# Patient Record
Sex: Female | Born: 1965 | Race: White | Hispanic: No | Marital: Married | Smoking: Never smoker
Health system: Southern US, Community
[De-identification: ages and names within clinical notes are randomized; demographics above are authoritative.]

## PROBLEM LIST (undated history)

## (undated) DIAGNOSIS — E079 Disorder of thyroid, unspecified: Secondary | ICD-10-CM

## (undated) DIAGNOSIS — D649 Anemia, unspecified: Secondary | ICD-10-CM

## (undated) DIAGNOSIS — Z808 Family history of malignant neoplasm of other organs or systems: Secondary | ICD-10-CM

## (undated) DIAGNOSIS — Z803 Family history of malignant neoplasm of breast: Secondary | ICD-10-CM

## (undated) DIAGNOSIS — E039 Hypothyroidism, unspecified: Secondary | ICD-10-CM

## (undated) DIAGNOSIS — Z923 Personal history of irradiation: Secondary | ICD-10-CM

## (undated) DIAGNOSIS — C801 Malignant (primary) neoplasm, unspecified: Secondary | ICD-10-CM

## (undated) HISTORY — DX: Family history of malignant neoplasm of other organs or systems: Z80.8

## (undated) HISTORY — DX: Family history of malignant neoplasm of breast: Z80.3

## (undated) HISTORY — DX: Disorder of thyroid, unspecified: E07.9

## (undated) HISTORY — PX: WISDOM TOOTH EXTRACTION: SHX21

---

## 1997-05-19 ENCOUNTER — Inpatient Hospital Stay (HOSPITAL_COMMUNITY): Admission: AD | Admit: 1997-05-19 | Discharge: 1997-05-21 | Payer: Self-pay | Admitting: Obstetrics and Gynecology

## 1997-05-23 ENCOUNTER — Encounter (HOSPITAL_COMMUNITY): Admission: RE | Admit: 1997-05-23 | Discharge: 1997-08-21 | Payer: Self-pay | Admitting: Obstetrics and Gynecology

## 1997-06-26 ENCOUNTER — Other Ambulatory Visit: Admission: RE | Admit: 1997-06-26 | Discharge: 1997-06-26 | Payer: Self-pay | Admitting: Obstetrics and Gynecology

## 1997-08-23 ENCOUNTER — Encounter (HOSPITAL_COMMUNITY): Admission: RE | Admit: 1997-08-23 | Discharge: 1997-11-21 | Payer: Self-pay | Admitting: *Deleted

## 1998-06-28 ENCOUNTER — Other Ambulatory Visit: Admission: RE | Admit: 1998-06-28 | Discharge: 1998-06-28 | Payer: Self-pay | Admitting: Obstetrics and Gynecology

## 1999-04-11 ENCOUNTER — Other Ambulatory Visit: Admission: RE | Admit: 1999-04-11 | Discharge: 1999-04-11 | Payer: Self-pay | Admitting: Obstetrics and Gynecology

## 1999-08-03 ENCOUNTER — Inpatient Hospital Stay (HOSPITAL_COMMUNITY): Admission: AD | Admit: 1999-08-03 | Discharge: 1999-08-03 | Payer: Self-pay | Admitting: *Deleted

## 1999-11-01 ENCOUNTER — Encounter: Payer: Self-pay | Admitting: Obstetrics and Gynecology

## 1999-11-01 ENCOUNTER — Inpatient Hospital Stay (HOSPITAL_COMMUNITY): Admission: AD | Admit: 1999-11-01 | Discharge: 1999-11-01 | Payer: Self-pay | Admitting: Obstetrics and Gynecology

## 1999-11-12 ENCOUNTER — Inpatient Hospital Stay (HOSPITAL_COMMUNITY): Admission: AD | Admit: 1999-11-12 | Discharge: 1999-11-15 | Payer: Self-pay | Admitting: Obstetrics and Gynecology

## 1999-11-16 ENCOUNTER — Encounter: Admission: RE | Admit: 1999-11-16 | Discharge: 2000-02-14 | Payer: Self-pay | Admitting: Obstetrics and Gynecology

## 1999-12-26 ENCOUNTER — Other Ambulatory Visit: Admission: RE | Admit: 1999-12-26 | Discharge: 1999-12-26 | Payer: Self-pay | Admitting: Obstetrics and Gynecology

## 2000-02-15 ENCOUNTER — Encounter: Admission: RE | Admit: 2000-02-15 | Discharge: 2000-03-27 | Payer: Self-pay | Admitting: Obstetrics and Gynecology

## 2001-02-18 ENCOUNTER — Other Ambulatory Visit: Admission: RE | Admit: 2001-02-18 | Discharge: 2001-02-18 | Payer: Self-pay | Admitting: Obstetrics and Gynecology

## 2001-09-20 ENCOUNTER — Inpatient Hospital Stay (HOSPITAL_COMMUNITY): Admission: AD | Admit: 2001-09-20 | Discharge: 2001-09-23 | Payer: Self-pay | Admitting: Obstetrics and Gynecology

## 2001-10-24 ENCOUNTER — Other Ambulatory Visit: Admission: RE | Admit: 2001-10-24 | Discharge: 2001-10-24 | Payer: Self-pay | Admitting: Obstetrics and Gynecology

## 2002-09-22 ENCOUNTER — Other Ambulatory Visit: Admission: RE | Admit: 2002-09-22 | Discharge: 2002-09-22 | Payer: Self-pay | Admitting: Obstetrics and Gynecology

## 2011-01-16 DIAGNOSIS — N951 Menopausal and female climacteric states: Secondary | ICD-10-CM | POA: Insufficient documentation

## 2011-01-16 DIAGNOSIS — E039 Hypothyroidism, unspecified: Secondary | ICD-10-CM | POA: Insufficient documentation

## 2014-07-21 ENCOUNTER — Ambulatory Visit (INDEPENDENT_AMBULATORY_CARE_PROVIDER_SITE_OTHER): Payer: Managed Care, Other (non HMO)

## 2014-07-21 ENCOUNTER — Ambulatory Visit (INDEPENDENT_AMBULATORY_CARE_PROVIDER_SITE_OTHER): Payer: Managed Care, Other (non HMO) | Admitting: Family Medicine

## 2014-07-21 ENCOUNTER — Ambulatory Visit (HOSPITAL_COMMUNITY)
Admission: RE | Admit: 2014-07-21 | Discharge: 2014-07-21 | Disposition: A | Discharge status: Home / Self Care | Payer: 59 | Source: Ambulatory Visit | Attending: Physician Assistant | Admitting: Physician Assistant

## 2014-07-21 VITALS — BP 108/72 | HR 77 | Temp 97.9°F | Resp 16 | Ht 68.0 in | Wt 142.2 lb

## 2014-07-21 DIAGNOSIS — R1084 Generalized abdominal pain: Secondary | ICD-10-CM

## 2014-07-21 DIAGNOSIS — D251 Intramural leiomyoma of uterus: Secondary | ICD-10-CM | POA: Diagnosis not present

## 2014-07-21 DIAGNOSIS — K5900 Constipation, unspecified: Secondary | ICD-10-CM

## 2014-07-21 LAB — POCT CBC
Granulocyte percent: 88.5 %G — AB (ref 37–80)
HCT, POC: 36.8 % — AB (ref 37.7–47.9)
Hemoglobin: 12 g/dL — AB (ref 12.2–16.2)
Lymph, poc: 0.9 (ref 0.6–3.4)
MCH, POC: 26.8 pg — AB (ref 27–31.2)
MCHC: 32.7 g/dL (ref 31.8–35.4)
MCV: 81.9 fL (ref 80–97)
MID (cbc): 0.7 (ref 0–0.9)
MPV: 7.3 fL (ref 0–99.8)
POC Granulocyte: 13 — AB (ref 2–6.9)
POC LYMPH PERCENT: 6.4 %L — AB (ref 10–50)
POC MID %: 5.1 %M (ref 0–12)
Platelet Count, POC: 308 10*3/uL (ref 142–424)
RBC: 4.5 M/uL (ref 4.04–5.48)
RDW, POC: 16.2 %
WBC: 14.7 10*3/uL — AB (ref 4.6–10.2)

## 2014-07-21 LAB — POCT UA - MICROSCOPIC ONLY
Bacteria, U Microscopic: NEGATIVE
Casts, Ur, LPF, POC: NEGATIVE
Crystals, Ur, HPF, POC: NEGATIVE
Mucus, UA: NEGATIVE
Yeast, UA: NEGATIVE

## 2014-07-21 LAB — POCT URINALYSIS DIPSTICK
Bilirubin, UA: NEGATIVE
Glucose, UA: NEGATIVE
Leukocytes, UA: NEGATIVE
Nitrite, UA: NEGATIVE
Protein, UA: NEGATIVE
Spec Grav, UA: 1.02
Urobilinogen, UA: 0.2
pH, UA: 7

## 2014-07-21 MED ORDER — POLYETHYLENE GLYCOL 3350 17 GM/SCOOP PO POWD
17.0000 g | Freq: Four times a day (QID) | ORAL | Status: DC
Start: 2014-07-21 — End: 2017-07-18

## 2014-07-21 MED ORDER — IOHEXOL 300 MG/ML  SOLN
100.0000 mL | Freq: Once | INTRAMUSCULAR | Status: AC | PRN
  Administered 2014-07-21: 100 mL via INTRAVENOUS

## 2014-07-21 MED ORDER — IOHEXOL 300 MG/ML  SOLN
50.0000 mL | Freq: Once | INTRAMUSCULAR | Status: AC | PRN
  Administered 2014-07-21: 50 mL via ORAL

## 2014-07-21 NOTE — Patient Instructions (Signed)
   Clear Liquid Diet A clear liquid diet is a short-term diet that is prescribed to provide the necessary fluid and basic energy you need when you can have nothing else. The clear liquid diet consists of liquids or solids that will become liquid at room temperature. You should be able to see through the liquid. There are many reasons that you may be restricted to clear liquids, such as:  When you have a sudden-onset (acute) condition that occurs before or after surgery.  To help your body slowly get adjusted to food again after a long period when you were unable to have food.  Replacement of fluids when you have a diarrheal disease.  When you are going to have certain exams, such as a colonoscopy, in which instruments are inserted inside your body to look at parts of your digestive system. WHAT CAN I HAVE? A clear liquid diet does not provide all the nutrients you need. It is important to choose a variety of the following items to get as many nutrients as possible:  Vegetable juices that do not have pulp.  Fruit juices and fruit drinks that do not have pulp.  Coffee (regular or decaffeinated), tea, or soda at the discretion of your health care provider.  Clear bouillon, broth, or strained broth-based soups.  High-protein and flavored gelatins.  Sugar or honey.  Ices or frozen ice pops that do not contain milk. If you are not sure whether you can have certain items, you should ask your health care provider. You may also ask your health care provider if there are any other clear liquid options. Document Released: 02/13/2005 Document Revised: 02/18/2013 Document Reviewed: 01/10/2013 [facility] Patient Information 2015 [place], [place]. This information is not intended to replace advice given to you by your health care provider. Make sure you discuss any questions you have with your health care provider.

## 2014-07-21 NOTE — Progress Notes (Signed)
07/21/2014 at 9:33 PM  Gloria Kramer / DOB: [birth year] / MRN: [id number]  The patient  does not have a problem list on file.  SUBJECTIVE  Chief complaint: Abdominal Pain; Flank Pain; Nausea; Emesis; and Dizziness  Abdominal Pain This is a new problem. The current episode started today. The onset quality is sudden. The problem occurs intermittently. The most recent episode lasted 2 hours. The problem has been waxing and waning. The pain is located in the generalized abdominal region. The pain is severe. The quality of the pain is colicky. Associated symptoms include constipation. Pertinent negatives include no belching, flatus, frequency, hematochezia, hematuria or melena. Nothing aggravates the pain. The pain is relieved by nothing. Gloria Kramer has tried nothing for the symptoms. The treatment provided no relief. There is no history of abdominal surgery, colon cancer, Crohn's disease, gallstones, GERD, irritable bowel syndrome, pancreatitis, PUD or ulcerative colitis.   The pain occurred with eating and resolved roughly 1.5 hours after eating.  There was nausea with the pain and radiation to the bilateral upper thighs.    Gloria Kramer  has a past medical history of Thyroid disease.    Medications reviewed and updated by myself where necessary, and exist elsewhere in the encounter.   Gloria Kramer is allergic to penicillins. Gloria Kramer  reports that Gloria Kramer has never smoked. Gloria Kramer does not have any smokeless tobacco history on file. Gloria Kramer reports that Gloria Kramer does not drink alcohol. Gloria Kramer  has no sexual activity history on file. The patient  has no past surgical history on file.  Gloria Kramer family history includes Cancer in Gloria Kramer maternal grandfather and mother; Heart disease in Gloria Kramer maternal grandmother; Stroke in Gloria Kramer mother.  Review of Systems  Gastrointestinal: Positive for abdominal pain and constipation. Negative for melena, hematochezia and flatus.  Genitourinary: Negative for frequency and hematuria.    OBJECTIVE  Gloria Kramer  height is  5\' 8"  (1.727 m) and weight is 142 lb 3.2 oz (64.501 kg). Gloria Kramer oral temperature is 97.9 F (36.6 C). Gloria Kramer blood pressure is 108/72 and Gloria Kramer pulse is 77. Gloria Kramer respiration is 16 and oxygen saturation is 98%.  The patient's body mass index is 21.63 kg/(m^2).  Physical Exam  Constitutional: Gloria Kramer is oriented to person, place, and time. Gloria Kramer appears well-developed and well-nourished.  HENT:  Head: Normocephalic.  Eyes: No scleral icterus.  Cardiovascular: Normal rate, regular rhythm and normal heart sounds.  Exam reveals no gallop and no friction rub.   No murmur heard. Respiratory: Effort normal and breath sounds normal. No respiratory distress. Gloria Kramer has no wheezes. Gloria Kramer has no rales. Gloria Kramer exhibits no tenderness.  GI: Soft. Bowel sounds are normal. Gloria Kramer exhibits no distension and no mass. There is no tenderness. There is no rebound and no guarding.  Neurological: Gloria Kramer is alert and oriented to person, place, and time. Gloria Kramer has normal reflexes.  Skin: Skin is warm and dry.  Psychiatric: Gloria Kramer has a normal mood and affect. Gloria Kramer behavior is normal. Judgment and thought content normal.    Results for orders placed or performed in visit on 07/21/14 (from the past 24 hour(s))  POCT CBC     Status: Abnormal   Collection Time: 07/21/14  1:34 PM  Result Value Ref Range   WBC 14.7 (A) 4.6 - 10.2 K/uL   Lymph, poc 0.9 0.6 - 3.4   POC LYMPH PERCENT 6.4 (A) 10 - 50 %L   MID (cbc) 0.7 0 - 0.9   POC MID % 5.1 0 - 12 %M   POC  Granulocyte 13.0 (A) 2 - 6.9   Granulocyte percent 88.5 (A) 37 - 80 %G   RBC 4.50 4.04 - 5.48 M/uL   Hemoglobin 12.0 (A) 12.2 - 16.2 g/dL   HCT, POC 36.8 (A) 37.7 - 47.9 %   MCV 81.9 80 - 97 fL   MCH, POC 26.8 (A) 27 - 31.2 pg   MCHC 32.7 31.8 - 35.4 g/dL   RDW, POC 16.2 %   Platelet Count, POC 308 142 - 424 K/uL   MPV 7.3 0 - 99.8 fL  POCT urinalysis dipstick     Status: None   Collection Time: 07/21/14  1:41 PM  Result Value Ref Range   Color, UA yellow    Clarity, UA clear    Glucose, UA  neg    Bilirubin, UA neg    Ketones, UA trace    Spec Grav, UA 1.020    Blood, UA tr-lysed    pH, UA 7.0    Protein, UA neg    Urobilinogen, UA 0.2    Nitrite, UA neg    Leukocytes, UA Negative   POCT UA - Microscopic Only     Status: None   Collection Time: 07/21/14  1:41 PM  Result Value Ref Range   WBC, Ur, HPF, POC 0-2    RBC, urine, microscopic 0-1    Bacteria, U Microscopic neg    Mucus, UA neg    Epithelial cells, urine per micros 0-2    Crystals, Ur, HPF, POC neg    Casts, Ur, LPF, POC neg    Yeast, UA neg    [facility] reading (PRIMARY) by  Dr. Brigitte Pulse: Abdomen negative for free air and dilated loops of bowel. No gall or kidney stone visualized.  Gloria Kramer has a large stool burden.  EXAM: CT ABDOMEN AND PELVIS WITH CONTRAST  TECHNIQUE: Multidetector CT imaging of the abdomen and pelvis was performed using the standard protocol following bolus administration of intravenous contrast.  CONTRAST: 160mL OMNIPAQUE IOHEXOL 300 MG/ML SOLN, 63mL OMNIPAQUE IOHEXOL 300 MG/ML SOLN  COMPARISON: None.  FINDINGS: Lower chest: Lung bases are clear.  Hepatobiliary: The liver appears normal. Gallbladder is normal.  Pancreas: Normal  Spleen: Normal  Adrenals/Urinary Tract: Adrenal glands and kidneys appear normal. No radiopaque renal or ureteral calculus. The bladder is decompressed but unremarkable.  Stomach/Bowel: No bowel wall thickening or focal segmental dilatation is identified. Appendix not visualized but no secondary evidence for acute appendicitis is identified.  Vascular/Lymphatic: No lymphadenopathy. No aortic aneurysm.  Other: Ovaries appear normal. Left lateral uterine body intramural mass measures 6.9 x 6.6 cm. Small amount of free pelvic fluid is identified.  Musculoskeletal: No acute osseous abnormality.  IMPRESSION: Left lateral uterine body probable fibroid, although internal low-density could indicate degeneration/necrosis which may  be symptomatic. No other etiology is identified to account for the patient's history of abdominal pain.  ASSESSMENT & PLAN  Shoni was seen today for abdominal pain, flank pain, nausea, emesis and dizziness.  Diagnoses and all orders for this visit:  Generalized abdominal pain: Non focal exam.  Vitals within normal limits. CT reassuring with respect to an acute process. The radiologist did comment on the possibly of a degenerative left uterine fibroid.  I have spoken with Dr. Nehemiah Settle, [facility] he feels that this finding is not acute nor emergent, and that pain control is the most important intervention. He did advise that Gloria Kramer be followed by his clinic for this problem.  I will place a referral  to [facility] for follow up and care of this problem. I have advised a "clean out" regimen including clear fluids and Miralax at 4 times daily dosing. CMET, Sed rate, and amylase are pending at this point.   Orders: -     POCT urinalysis dipstick -     POCT UA - Microscopic Only -     POCT CBC -     Comprehensive metabolic panel -     Calcium, ionized   The patient was advised to call or come back to clinic if Gloria Kramer does not see an improvement in symptoms, or worsens with the above plan.   Philis Fendt, MHS, PA-C [facility] 07/21/2014 9:33 PM

## 2014-07-21 NOTE — Progress Notes (Signed)
Patient ID: Gloria Kramer, female   DOB: January 03, 1966, 49 y.o.   MRN: [id number] Reviewed documentation and xray and agree w/ assessment and plan. Delman Cheadle, MD MPH

## 2014-07-22 LAB — LIPASE: Lipase: 27 U/L (ref 0–75)

## 2014-07-22 LAB — COMPREHENSIVE METABOLIC PANEL
ALT: 12 U/L (ref 0–35)
AST: 17 U/L (ref 0–37)
Albumin: 4.4 g/dL (ref 3.5–5.2)
Alkaline Phosphatase: 55 U/L (ref 39–117)
BUN: 16 mg/dL (ref 6–23)
CO2: 21 mEq/L (ref 19–32)
Calcium: 9.5 mg/dL (ref 8.4–10.5)
Chloride: 104 mEq/L (ref 96–112)
Creat: 0.74 mg/dL (ref 0.50–1.10)
Glucose, Bld: 98 mg/dL (ref 70–99)
Potassium: 4.1 mEq/L (ref 3.5–5.3)
Sodium: 138 mEq/L (ref 135–145)
Total Bilirubin: 1.3 mg/dL — ABNORMAL HIGH (ref 0.2–1.2)
Total Protein: 7.2 g/dL (ref 6.0–8.3)

## 2014-07-22 LAB — CALCIUM, IONIZED: Calcium, Ion: 1.25 mmol/L (ref 1.12–1.32)

## 2014-07-23 ENCOUNTER — Telehealth: Payer: Self-pay | Admitting: Obstetrics and Gynecology

## 2014-07-23 ENCOUNTER — Other Ambulatory Visit: Payer: Self-pay | Admitting: Obstetrics and Gynecology

## 2014-07-23 DIAGNOSIS — D251 Intramural leiomyoma of uterus: Secondary | ICD-10-CM

## 2014-07-23 NOTE — Telephone Encounter (Signed)
I placed call to patient to discuss benefits and scheduling. Patient declines appointment due to cost. She will contact her pcp about obtaining a referral to a different provider.

## 2014-08-06 ENCOUNTER — Other Ambulatory Visit: Payer: Managed Care, Other (non HMO) | Admitting: Obstetrics and Gynecology

## 2014-08-06 ENCOUNTER — Other Ambulatory Visit: Payer: Managed Care, Other (non HMO)

## 2015-03-25 ENCOUNTER — Encounter: Payer: Self-pay | Admitting: Pediatric Endocrinology

## 2015-04-21 ENCOUNTER — Encounter (INDEPENDENT_AMBULATORY_CARE_PROVIDER_SITE_OTHER): Payer: Self-pay

## 2016-08-03 ENCOUNTER — Other Ambulatory Visit: Payer: Self-pay | Admitting: Family Medicine

## 2016-08-03 DIAGNOSIS — N921 Excessive and frequent menstruation with irregular cycle: Secondary | ICD-10-CM

## 2016-08-23 ENCOUNTER — Other Ambulatory Visit: Payer: 59

## 2017-01-25 LAB — CBC AND DIFFERENTIAL
HEMATOCRIT: 22 — AB (ref 36–46)
Hemoglobin: 6.8 — AB (ref 12.0–16.0)
Neutrophils Absolute: 4
PLATELETS: 369 (ref 150–399)
WBC: 4.7

## 2017-01-25 LAB — BASIC METABOLIC PANEL
BUN: 12 (ref 4–21)
Creatinine: 0.6 (ref <=1.1)
GLUCOSE: 102
POTASSIUM: 4.6 (ref 3.4–5.3)
Sodium: 140 (ref 137–147)

## 2017-01-25 LAB — HEPATIC FUNCTION PANEL: BILIRUBIN DIRECT: 2 — AB

## 2017-01-25 LAB — LIPID PANEL
Cholesterol: 112 (ref 0–200)
HDL: 40 (ref 35–70)
LDL Cholesterol: 57
Triglycerides: 76 (ref 40–160)

## 2017-01-25 LAB — TSH: TSH: 0.73 (ref <=5.90)

## 2017-02-09 ENCOUNTER — Telehealth: Payer: Self-pay | Admitting: Oncology

## 2017-02-09 NOTE — Telephone Encounter (Signed)
Left message on voicemail with appointment Date/Time/Location/Phone # .

## 2017-03-02 ENCOUNTER — Encounter: Payer: 59 | Admitting: Oncology

## 2017-03-08 ENCOUNTER — Inpatient Hospital Stay: Payer: 59

## 2017-03-08 ENCOUNTER — Telehealth: Payer: Self-pay | Admitting: Oncology

## 2017-03-08 ENCOUNTER — Inpatient Hospital Stay: Payer: 59 | Attending: Oncology | Admitting: Oncology

## 2017-03-08 VITALS — BP 127/79 | HR 113 | Temp 97.8°F | Resp 18 | Ht 68.0 in | Wt 126.8 lb

## 2017-03-08 DIAGNOSIS — E039 Hypothyroidism, unspecified: Secondary | ICD-10-CM | POA: Diagnosis not present

## 2017-03-08 DIAGNOSIS — N920 Excessive and frequent menstruation with regular cycle: Secondary | ICD-10-CM | POA: Insufficient documentation

## 2017-03-08 DIAGNOSIS — D509 Iron deficiency anemia, unspecified: Secondary | ICD-10-CM | POA: Insufficient documentation

## 2017-03-08 DIAGNOSIS — D259 Leiomyoma of uterus, unspecified: Secondary | ICD-10-CM | POA: Diagnosis not present

## 2017-03-08 LAB — CBC WITH DIFFERENTIAL (CANCER CENTER ONLY)
BASOS ABS: 0 10*3/uL (ref 0.0–0.1)
BASOS PCT: 0 %
Eosinophils Absolute: 0 10*3/uL (ref 0.0–0.5)
Eosinophils Relative: 0 %
HEMATOCRIT: 24.1 % — AB (ref 34.8–46.6)
HEMOGLOBIN: 7.2 g/dL — AB (ref 11.6–15.9)
Lymphocytes Relative: 8 %
Lymphs Abs: 0.6 10*3/uL — ABNORMAL LOW (ref 0.9–3.3)
MCH: 20.9 pg — ABNORMAL LOW (ref 25.1–34.0)
MCHC: 29.6 g/dL — ABNORMAL LOW (ref 31.5–36.0)
MCV: 70.5 fL — ABNORMAL LOW (ref 79.5–101.0)
MONO ABS: 0.4 10*3/uL (ref 0.1–0.9)
Monocytes Relative: 4 %
NEUTROS ABS: 7.1 10*3/uL — AB (ref 1.5–6.5)
NEUTROS PCT: 88 %
Platelet Count: 347 10*3/uL (ref 145–400)
RBC: 3.42 MIL/uL — ABNORMAL LOW (ref 3.70–5.45)
RDW: 17.9 % — AB (ref 11.2–16.1)
WBC: 8.1 10*3/uL (ref 3.9–10.3)

## 2017-03-08 LAB — IRON AND TIBC
IRON: 12 ug/dL — AB (ref 41–142)
SATURATION RATIOS: 3 % — AB (ref 21–57)
TIBC: 424 ug/dL (ref 236–444)
UIBC: 412 ug/dL

## 2017-03-08 LAB — FERRITIN: Ferritin: 5 ng/mL — ABNORMAL LOW (ref 9–269)

## 2017-03-08 NOTE — Telephone Encounter (Signed)
Scheduled appt per 1/10 los - Gave patient AVS and calender per los. - pt unable to come in next week for iron - scheduled the following week .

## 2017-03-08 NOTE — Progress Notes (Signed)
Reason for Referral: Iron deficiency anemia.  HPI: 52 year old woman currently of [place] after relocating this area in 2012.  She is originally from the state of [place].  She does have history of hypothyroidism currently on Synthroid supplements.  She also has history of uterine fibroids and heavy menstrual cycles or an extended period of time.  She has had 5 pregnancies and gave birth without any complications.  She had a CBC done on January 24, 2017 showed a hemoglobin of 6.8, hematocrit of 22, MCV of 76 with an elevated RDW of 18.2.  She had normal white cell count and normal platelet count.  Normal liver function tests and creatinine.  Previous CBC done in August 2018 showed a hemoglobin of 8.9 with MCV of 76.  She has started on oral iron replacement in November 2018 although she has been on it intermittently before that.  She has been taking four iron tablets every day with some improvement in her symptoms.  She reported that her fatigue is slightly less although it is difficult for her to determine as she remains busy and chronically fatigued.  She denied any ice cravings but does report hair changes.  She denies any hematochezia, melena hemoptysis or any other bleeding.  She has never had a colonoscopy in the past.  She denies any changes in her bowel habits.  She reports her menstrual cycle in January with lighter than normal but in December was extremely heavy and lasted for over a week.  She denied any headaches, blurry vision, syncope or seizures.  She does not report any fevers, chills, sweats or weight loss.  She does not up any chest pain, palpitation orthopnea.  She does not report any cough, wheezing or hemoptysis.  She does not report any nausea, vomiting or abdominal pain.  She does not report any constipation or diarrhea.  She does not report any frequency urgency or hesitancy.  She does not report any skeletal complaints of arthralgias or myalgias or joint tenderness.  She  does not report anxiety or depression.  She does not report any petechiae or easy bruising.  The remaining review of system is negative.  Past Medical History:  Diagnosis Date  . Thyroid disease   :     Current Outpatient Medications:  .  levothyroxine (SYNTHROID, LEVOTHROID) 100 MCG tablet, Take 100 mcg by mouth daily before breakfast., Disp: , Rfl:  .  polyethylene glycol powder (GLYCOLAX/MIRALAX) powder, Take 17 g by mouth 4 (four) times daily. Take for 5 days., Disp: 3350 g, Rfl: 1:  Allergies  Allergen Reactions  . Penicillins Other (See Comments)    Unsure reaction was when she was a child  :  Family History  Problem Relation Age of Onset  . Cancer Mother   . Stroke Mother   . Heart disease Maternal Grandmother   . Cancer Maternal Grandfather   :  Social History   Socioeconomic History  . Marital status: Married    Spouse name: Not on file  . Number of children: Not on file  . Years of education: Not on file  . Highest education level: Not on file  Social Needs  . Financial resource strain: Not on file  . Food insecurity - worry: Not on file  . Food insecurity - inability: Not on file  . Transportation needs - medical: Not on file  . Transportation needs - non-medical: Not on file  Occupational History  . Not on file  Tobacco Use  . Smoking  status: Never Smoker  Substance and Sexual Activity  . Alcohol use: No    Alcohol/week: 0.0 oz  . Drug use: Not on file  . Sexual activity: Not on file  Other Topics Concern  . Not on file  Social History Narrative  . Not on file  :  Pertinent items are noted in HPI.  Exam: Blood pressure 127/79, pulse (!) 113, temperature 97.8 F (36.6 C), temperature source Oral, resp. rate 18, height 5\' 8"  (1.727 m), weight 126 lb 12.8 oz (57.5 kg), SpO2 100 %. General appearance: alert and cooperative appeared without distress. Eyes: conjunctivae/corneas clear. PERRL.  Throat: lips, mucosa, and tongue normal; teeth and gums  normal Resp: clear to auscultation bilaterally without rhonchi or wheezes. Cardio: regular rate and rhythm, S1, S2 normal, no murmur, click, rub or gallop GI: soft, non-tender; bowel sounds normal; no masses,  no organomegaly Skeletal exam: No joint swelling or tenderness.  No edema noted. Skin: Skin color, texture, turgor normal. No rashes or lesions Lymph nodes: Cervical, supraclavicular, and axillary nodes normal. Neurologic: Grossly normal any motor or sensory deficits.  CBC    Component Value Date/Time   WBC 14.7 (A) 07/21/2014 1334   RBC 4.50 07/21/2014 1334   HGB 12.0 (A) 07/21/2014 1334   HCT 36.8 (A) 07/21/2014 1334   MCV 81.9 07/21/2014 1334   MCH 26.8 (A) 07/21/2014 1334   MCHC 32.7 07/21/2014 1334     Chemistry      Component Value Date/Time   NA 138 07/21/2014 1323   K 4.1 07/21/2014 1323   CL 104 07/21/2014 1323   CO2 21 07/21/2014 1323   BUN 16 07/21/2014 1323   CREATININE 0.74 07/21/2014 1323      Component Value Date/Time   CALCIUM 9.5 07/21/2014 1323   ALKPHOS 55 07/21/2014 1323   AST 17 07/21/2014 1323   ALT 12 07/21/2014 1323   BILITOT 1.3 (H) 07/21/2014 1323       Assessment and Plan:   52 year old woman with the following issues:  1.  Iron deficiency diagnosed in August 2018.  At that time her hemoglobin was 8.9 with an MCV of 76 and elevated RDW.  She was started on oral iron replacement at the time and repeat CBC in November 2018 showed worsening hemoglobin to 6.8.  She is currently taking oral iron replacement 4 times a day.   The differential diagnosis and the natural course of this disease was reviewed today.  She has iron deficiency anemia related to menstrual blood losses over the years.  She appears to have not able to replace iron loss orally for many reasons.  This could be related to inadequate intake and potentially absorption issues.  Options of therapy at this time was discussed which include continuing oral iron at a higher doses  versus intravenous iron.  Risks and benefits associated with Feraheme were reviewed today.  These complications would include arthralgias, myalgias, infusion related reactions and rarely anaphylaxis.  After discussion today, she is agreeable to proceed with 1000 mg of Feraheme in split infusions.  The benefit of this approach would to circumvent absorption issues and rapid improvement in her iron stores and improvement in her symptoms.  I will continue to check her iron levels periodically to ensure adequacy.  She will require repeat IV iron in the future if needed 2.  2.  Menorrhagia: Related to uterine fibroids.  He understands as long as this is an ongoing issue she will continue to have iron deficiency.  More radical approach such as hysterectomy is likely not to needed at this time.  3.  Colon cancer screening: Appropriate to consider colon cancer screening in her at this time.  Colonoscopy will be encouraged moving forward.  4.  Follow-up: Will be in the next 3-4 months to follow her progress.  45 minutes was spent today face-to-face with the patient.  More than 50% of that time was spent on counseling, coordination of care and education of the patient.  This includes different strategies to combat worsening of her condition as well as treatment options.

## 2017-03-20 ENCOUNTER — Inpatient Hospital Stay: Payer: 59

## 2017-03-20 VITALS — BP 115/71 | HR 80 | Temp 97.7°F | Resp 16

## 2017-03-20 DIAGNOSIS — D509 Iron deficiency anemia, unspecified: Secondary | ICD-10-CM | POA: Diagnosis not present

## 2017-03-20 MED ORDER — SODIUM CHLORIDE 0.9 % IV SOLN
Freq: Once | INTRAVENOUS | Status: AC
  Administered 2017-03-20: 10:00:00 via INTRAVENOUS

## 2017-03-20 MED ORDER — SODIUM CHLORIDE 0.9 % IV SOLN
510.0000 mg | Freq: Once | INTRAVENOUS | Status: AC
  Administered 2017-03-20: 510 mg via INTRAVENOUS
  Filled 2017-03-20: qty 17

## 2017-03-20 NOTE — Patient Instructions (Signed)

## 2017-03-27 ENCOUNTER — Inpatient Hospital Stay: Payer: 59

## 2017-03-27 VITALS — BP 132/75 | HR 69 | Temp 98.3°F | Resp 16 | Wt 125.2 lb

## 2017-03-27 DIAGNOSIS — D509 Iron deficiency anemia, unspecified: Secondary | ICD-10-CM

## 2017-03-27 MED ORDER — SODIUM CHLORIDE 0.9 % IV SOLN
Freq: Once | INTRAVENOUS | Status: AC
  Administered 2017-03-27: 12:00:00 via INTRAVENOUS

## 2017-03-27 MED ORDER — SODIUM CHLORIDE 0.9 % IV SOLN
510.0000 mg | Freq: Once | INTRAVENOUS | Status: AC
  Administered 2017-03-27: 510 mg via INTRAVENOUS
  Filled 2017-03-27: qty 17

## 2017-03-27 NOTE — Patient Instructions (Signed)

## 2017-06-29 ENCOUNTER — Other Ambulatory Visit: Payer: Self-pay | Admitting: Obstetrics and Gynecology

## 2017-06-29 DIAGNOSIS — N6320 Unspecified lump in the left breast, unspecified quadrant: Secondary | ICD-10-CM

## 2017-07-03 ENCOUNTER — Other Ambulatory Visit: Payer: Self-pay | Admitting: Obstetrics and Gynecology

## 2017-07-03 ENCOUNTER — Ambulatory Visit
Admission: RE | Admit: 2017-07-03 | Discharge: 2017-07-03 | Disposition: A | Discharge status: Home / Self Care | Payer: 59 | Source: Ambulatory Visit | Attending: Obstetrics and Gynecology | Admitting: Obstetrics and Gynecology

## 2017-07-03 DIAGNOSIS — N6320 Unspecified lump in the left breast, unspecified quadrant: Secondary | ICD-10-CM

## 2017-07-04 ENCOUNTER — Other Ambulatory Visit: Payer: 59

## 2017-07-09 ENCOUNTER — Ambulatory Visit
Admission: RE | Admit: 2017-07-09 | Discharge: 2017-07-09 | Disposition: A | Discharge status: Home / Self Care | Payer: 59 | Source: Ambulatory Visit | Attending: Obstetrics and Gynecology | Admitting: Obstetrics and Gynecology

## 2017-07-09 DIAGNOSIS — N6320 Unspecified lump in the left breast, unspecified quadrant: Secondary | ICD-10-CM

## 2017-07-09 HISTORY — PX: BREAST BIOPSY: SHX20

## 2017-07-10 ENCOUNTER — Telehealth: Payer: Self-pay | Admitting: Oncology

## 2017-07-10 ENCOUNTER — Inpatient Hospital Stay: Payer: POS | Attending: Oncology | Admitting: Oncology

## 2017-07-10 ENCOUNTER — Inpatient Hospital Stay: Payer: POS

## 2017-07-10 VITALS — BP 116/75 | HR 77 | Temp 98.7°F | Resp 18 | Ht 68.0 in | Wt 126.9 lb

## 2017-07-10 DIAGNOSIS — N63 Unspecified lump in unspecified breast: Secondary | ICD-10-CM

## 2017-07-10 DIAGNOSIS — D509 Iron deficiency anemia, unspecified: Secondary | ICD-10-CM

## 2017-07-10 DIAGNOSIS — D500 Iron deficiency anemia secondary to blood loss (chronic): Secondary | ICD-10-CM | POA: Insufficient documentation

## 2017-07-10 DIAGNOSIS — N920 Excessive and frequent menstruation with regular cycle: Secondary | ICD-10-CM

## 2017-07-10 LAB — IRON AND TIBC
Iron: 139 ug/dL (ref 41–142)
Saturation Ratios: 35 % (ref 21–57)
TIBC: 399 ug/dL (ref 236–444)
UIBC: 260 ug/dL

## 2017-07-10 LAB — CBC WITH DIFFERENTIAL (CANCER CENTER ONLY)
BASOS ABS: 0 10*3/uL (ref 0.0–0.1)
Basophils Relative: 0 %
EOS PCT: 1 %
Eosinophils Absolute: 0 10*3/uL (ref 0.0–0.5)
HCT: 38.8 % (ref 34.8–46.6)
Hemoglobin: 13.1 g/dL (ref 11.6–15.9)
Lymphocytes Relative: 16 %
Lymphs Abs: 0.6 10*3/uL — ABNORMAL LOW (ref 0.9–3.3)
MCH: 30.3 pg (ref 25.1–34.0)
MCHC: 33.8 g/dL (ref 31.5–36.0)
MCV: 89.5 fL (ref 79.5–101.0)
MONO ABS: 0.2 10*3/uL (ref 0.1–0.9)
Monocytes Relative: 5 %
Neutro Abs: 3 10*3/uL (ref 1.5–6.5)
Neutrophils Relative %: 78 %
PLATELETS: 222 10*3/uL (ref 145–400)
RBC: 4.34 MIL/uL (ref 3.70–5.45)
RDW: 13.6 % (ref 11.2–14.5)
WBC: 3.9 10*3/uL (ref 3.9–10.3)

## 2017-07-10 LAB — FERRITIN: FERRITIN: 9 ng/mL (ref 9–269)

## 2017-07-10 NOTE — Telephone Encounter (Signed)
Scheduled appt per 5/14 los - no print out wanted - pt my chart active.

## 2017-07-10 NOTE — Progress Notes (Signed)
Hematology and Oncology Follow Up Visit  Gloria Kramer [id number] Jul 10, 1965 52 y.o. 07/10/2017 9:31 AM Patient, No Pcp PerNo ref. provider found   Principle Diagnosis: 52 year old woman with iron deficiency anemia diagnosed in August 2018.  Presented with a hemoglobin of 7.2, iron of 12 and ferritin of 5 in January 2019.  Due to to menstrual losses.   Prior Therapy:  IV iron form of Feraheme completed in January 2019.  Current therapy:   Interim History: Gloria Kramer presents today for a follow-up visit.  The last visit, she received intravenous iron tolerated well.  She also found to have an abnormal mammogram on Jul 03, 2017.  She had an ultrasound-guided biopsy for a suspicious mass on her left breast.  This measuring 1.6 x 1.8 x 2.5 cm.  She is awaiting the results of the biopsy today.  She reports that she has significant improvement in her symptoms after intravenous iron infusion.  She reported improvement in her iron cravings and increase in her energy her performance status.  Her fatigue has resolved and able to attend to activities of daily living.  She does not report any headaches, blurry vision, syncope or seizures. Does not report any fevers, chills or sweats.  Does not report any cough, wheezing or hemoptysis.  Does not report any chest pain, palpitation, orthopnea or leg edema.  Does not report any nausea, vomiting or abdominal pain.  Does not report any constipation or diarrhea.  Does not report any skeletal complaints.    Does not report frequency, urgency or hematuria.  Does not report any skin rashes or lesions. Does not report any lymphadenopathy or petechiae.  Remaining review of systems is negative.    Medications: I have reviewed the patient's current medications.  Current Outpatient Medications  Medication Sig Dispense Refill  . levothyroxine (SYNTHROID, LEVOTHROID) 100 MCG tablet Take 100 mcg by mouth daily before breakfast.    . polyethylene glycol powder  (GLYCOLAX/MIRALAX) powder Take 17 g by mouth 4 (four) times daily. Take for 5 days. 3350 g 1   No current facility-administered medications for this visit.      Allergies:  Allergies  Allergen Reactions  . Penicillins Other (See Comments)    Unsure reaction was when she was a child    Past Medical History, Surgical history, Social history, and Family History were reviewed and updated.    Physical Exam: Blood pressure 116/75, pulse 77, temperature 98.7 F (37.1 C), temperature source Oral, resp. rate 18, height _0  (1.727 m), weight 126 lb 14.4 oz (57.6 kg), SpO2 100 %.   ECOG: 0 General appearance: alert and cooperative appeared without distress. Head: Normocephalic, without obvious abnormality Oropharynx: No oral thrush or ulcers. Eyes: No scleral icterus.  Pupils are equal and round reactive to light. Lymph nodes: Cervical, supraclavicular, and axillary nodes normal. Heart:regular rate and rhythm, S1, S2 normal, no murmur, click, rub or gallop Lung:chest clear, no wheezing, rales, normal symmetric air entry Abdomin: soft, non-tender, without masses or organomegaly. Neurological: No motor, sensory deficits.  Intact deep tendon reflexes. Skin: No rashes or lesions.  No ecchymosis or petechiae. Musculoskeletal: No joint deformity or effusion. Psychiatric: Mood and affect are appropriate.    Lab Results: Lab Results  Component Value Date   WBC 8.1 03/08/2017   HGB 7.2 (L) 03/08/2017   HCT 24.1 (L) 03/08/2017   MCV 70.5 (L) 03/08/2017   PLT 347 03/08/2017     Chemistry      Component Value Date/Time  NA 138 07/21/2014 1323   K 4.1 07/21/2014 1323   CL 104 07/21/2014 1323   CO2 21 07/21/2014 1323   BUN 16 07/21/2014 1323   CREATININE 0.74 07/21/2014 1323      Component Value Date/Time   CALCIUM 9.5 07/21/2014 1323   ALKPHOS 55 07/21/2014 1323   AST 17 07/21/2014 1323   ALT 12 07/21/2014 1323   BILITOT 1.3 (H) 07/21/2014 1323       Radiological  Studies: Mm Clip Placement Left  Result Date: 07/09/2017 CLINICAL DATA:  Status post ultrasound-guided core biopsy of a mass in the left breast. EXAM: DIAGNOSTIC LEFT MAMMOGRAM POST ULTRASOUND BIOPSY COMPARISON:  Previous exam(s). FINDINGS: Mammographic images were obtained following ultrasound guided biopsy of a mass in the upper-inner quadrant of the left breast. Mammographic images show there is a ribbon shaped clip in appropriate position in the upper-inner quadrant of the left breast. IMPRESSION: Status post ultrasound-guided core biopsy of a left breast mass with pathology pending. Final Assessment: Post Procedure Mammograms for Marker Placement Electronically Signed   By: Lillia Mountain M.D.   On: 07/09/2017 14:40   [place] Lt Breast Bx W Loc Dev 1st Lesion Img Bx Spec US Guide  Result Date: 07/09/2017 CLINICAL DATA:  Left breast mass. EXAM: ULTRASOUND GUIDED LEFT BREAST CORE NEEDLE BIOPSY COMPARISON:  Previous exam(s). FINDINGS: I met with the patient and we discussed the procedure of ultrasound-guided biopsy, including benefits and alternatives. We discussed the high likelihood of a successful procedure. We discussed the risks of the procedure, including infection, bleeding, tissue injury, clip migration, and inadequate sampling. Informed written consent was given. The usual time-out protocol was performed immediately prior to the procedure. Lesion quadrant: Upper inner quadrant. Using sterile technique and 1% Lidocaine as local anesthetic, under direct ultrasound visualization, a 14 gauge spring-loaded device was used to perform biopsy of a mass in the 10 o'clock region of the left breast using a inferior to superior approach. At the conclusion of the procedure a ribbon shaped tissue marker clip was deployed into the biopsy cavity. Follow up 2 view mammogram was performed and dictated separately. IMPRESSION: Ultrasound guided biopsy of a mass in the 10 o'clock region of the left breast. No apparent  complications. Electronically Signed   By: Lillia Mountain M.D.   On: 07/09/2017 14:16     Impression and Plan:  52 year old woman with:   1.  Iron deficiency diagnosed January 2019.  She received intravenous iron in the form of Feraheme for total 1 g completedon March 27, 2017 with significant improvement in her symptoms.  Iron deficiency has been related to chronic menstrual blood losses and has been intolerant to oral iron.  Her hemoglobin is 13.1 with significant improvement after her iron infusion.  The plan is to continue with active surveillance at this time and check her iron studies periodically and replace as needed.   2.  Menorrhagia: Related to uterine fibroids.  She continues to follow with gynecology regarding this issue.  3.  Colon cancer screening: I recommended colonoscopy in the future to make sure there is no GI blood losses.  4.  Breast mass: Measuring 2.5 x 1.8 x 1.6 cm highly suspicious for malignancy.  This has been biopsied and the results are pending.  If indeed breast malignancy is confirmed, she will likely require surgical consultation as well as evaluation by breast oncology.  5.  Follow-up: Will be in 4 months repeat iron studies.  15  minutes was spent with the  patient face-to-face today.  More than 50% of time was dedicated to patient counseling, education and answering questions regarding future plan of care.     Zola Button, MD 5/14/20199:31 AM

## 2017-07-12 ENCOUNTER — Encounter: Payer: Managed Care, Other (non HMO) | Admitting: Family Medicine

## 2017-07-13 ENCOUNTER — Encounter: Payer: Self-pay | Admitting: Radiation Oncology

## 2017-07-17 ENCOUNTER — Telehealth: Payer: Self-pay | Admitting: Hematology and Oncology

## 2017-07-17 NOTE — Progress Notes (Signed)
Location of Breast Cancer: Invasive ductal carcinoma of left breast.  Mammogram 07/03/2017: Suspicious mass 10 O'clock position left breast 3 cm from the nipple measuring 1.6 x 1.8 x 2.5 cm mass.  Histology per Pathology Report: 07/09/2017 Left Breast   Receptor Status: ER(+ 95%), PR (+ 90%), Her2-neu (-), Ki-67(15%)  Did patient present with symptoms (if so, please note symptoms) or was this found on screening mammography?:  She had an abnormal mammogram on 07/03/2017.  Past/Anticipated interventions by surgeon, if any: Did not have the final path results.  Discussed many options.  Past/Anticipated interventions by medical oncology, if any: Chemotherapy  Appointment scheduled with Dr. Lindi Adie on 07/24/2017 at 1 pm  Plastic surgeon appointment on 07/24/2017 3 pm.  Lymphedema issues, if any: No   Pain issues, if any:  No, sore tender at the biopsy site.  BP 122/81 (BP Location: Right Arm, Patient Position: Sitting, Cuff Size: Normal)   Pulse 82   Temp 97.8 F (36.6 C) (Oral)   Resp 18   Ht _0  (1.727 m)   Wt 127 lb 12.8 oz (58 kg)   SpO2 99%   BMI 19.43 kg/m    Wt Readings from Last 3 Encounters:  07/18/17 127 lb 12.8 oz (58 kg)  07/10/17 126 lb 14.4 oz (57.6 kg)  03/27/17 125 lb 4 oz (56.8 kg)   SAFETY ISSUES:  Prior radiation? No  Pacemaker/ICD? No  Possible current pregnancy? No  Is the patient on methotrexate? No  Current Complaints / other details:      Cori Razor, RN 07/17/2017,3:05 PM

## 2017-07-17 NOTE — Telephone Encounter (Signed)
Received a a message from Gloria Kramer from [facility] to schedule the pt for a breast oncology appt. Tc to the pt who has been scheduled to see Dr. Lindi Adie on 5/28 at 1pm.

## 2017-07-18 ENCOUNTER — Other Ambulatory Visit: Payer: Self-pay

## 2017-07-18 ENCOUNTER — Encounter: Payer: Self-pay | Admitting: Radiation Oncology

## 2017-07-18 ENCOUNTER — Ambulatory Visit
Admission: RE | Admit: 2017-07-18 | Discharge: 2017-07-18 | Disposition: A | Discharge status: Home / Self Care | Payer: POS | Source: Ambulatory Visit | Attending: Radiation Oncology | Admitting: Radiation Oncology

## 2017-07-18 VITALS — BP 122/81 | HR 82 | Temp 97.8°F | Resp 18 | Ht 68.0 in | Wt 127.8 lb

## 2017-07-18 DIAGNOSIS — Z170 Estrogen receptor positive status [ER+]: Secondary | ICD-10-CM | POA: Diagnosis not present

## 2017-07-18 DIAGNOSIS — C50212 Malignant neoplasm of upper-inner quadrant of left female breast: Secondary | ICD-10-CM | POA: Insufficient documentation

## 2017-07-18 DIAGNOSIS — C50912 Malignant neoplasm of unspecified site of left female breast: Secondary | ICD-10-CM

## 2017-07-18 DIAGNOSIS — Z79899 Other long term (current) drug therapy: Secondary | ICD-10-CM | POA: Diagnosis not present

## 2017-07-18 DIAGNOSIS — E079 Disorder of thyroid, unspecified: Secondary | ICD-10-CM | POA: Insufficient documentation

## 2017-07-18 DIAGNOSIS — Z809 Family history of malignant neoplasm, unspecified: Secondary | ICD-10-CM | POA: Diagnosis not present

## 2017-07-18 NOTE — Progress Notes (Signed)
Radiation Oncology         [phone]) [phone] ________________________________  Name: Gloria Kramer        MRN: [id number]  Date of Service: 07/18/2017 DOB: 25-Sep-1965  MG:[id number], No Pcp Per  Jovita Kussmaul, MD     REFERRING PHYSICIAN: Autumn Messing III, MD   DIAGNOSIS: The primary encounter diagnosis was Invasive ductal carcinoma of breast, female, left ([place]). A diagnosis of Malignant neoplasm of upper-inner quadrant of left breast in female, estrogen receptor positive ([place]) was also pertinent to this visit.   HISTORY OF PRESENT ILLNESS: Gloria Kramer is a 51 y.o. female seen at the request of Dr. Marlou Starks for a newly diagnosed left breast cancer. She had a mass in the left breast and diagnostic imaging revealed a 2 cm mass in the upper inner quadrant. She underwent an ultrasound that at 10:00 position, she revealed a 1.6 x 1.8 x 2.5 cm mass with internal microcalcifications and some skin thickening, and her axilla was negative for adenopathy. She did have a biopsy on 07/09/17 which revealed a grade 2 invasive ductal carcinoma, ER/PR positive, HER 2 negative with a Ki 67 of 15%. She comes today to discuss options for treatment of her cancer.    PREVIOUS RADIATION THERAPY: No   PAST MEDICAL HISTORY:  Past Medical History:  Diagnosis Date  . Thyroid disease        PAST SURGICAL HISTORY: Past Surgical History:  Procedure Laterality Date  . WISDOM TOOTH EXTRACTION       FAMILY HISTORY:  Family History  Problem Relation Age of Onset  . Cancer Mother   . Stroke Mother   . Heart disease Maternal Grandmother   . Cancer Maternal Grandfather      SOCIAL HISTORY:  reports that she has never smoked. She has never used smokeless tobacco. She reports that she does not drink alcohol or use drugs. The patient is married and lives in [place]. She has five children.   ALLERGIES: Penicillins   MEDICATIONS:  Current Outpatient Medications  Medication Sig Dispense Refill  . acetaminophen  (TYLENOL) 500 MG tablet Take by mouth.    . Calcium-Vitamin D-Vitamin K [id number] MG-UNT-MCG CHEW Chew by mouth.    . Multiple Vitamin (MULTIVITAMIN) capsule Take by mouth.    . SYNTHROID 125 MCG tablet     . tranexamic acid (LYSTEDA) 650 MG TABS tablet      No current facility-administered medications for this encounter.      REVIEW OF SYSTEMS: On review of systems, the patient reports that she is doing well overall. She denies any chest pain, shortness of breath, cough, fevers, chills, night sweats, unintended weight changes. She denies any bowel or bladder disturbances, and denies abdominal pain, nausea or vomiting. She denies any new musculoskeletal or joint aches or pains. A complete review of systems is obtained and is otherwise negative.     PHYSICAL EXAM:  Wt Readings from Last 3 Encounters:  07/18/17 127 lb 12.8 oz (58 kg)  07/10/17 126 lb 14.4 oz (57.6 kg)  03/27/17 125 lb 4 oz (56.8 kg)   Temp Readings from Last 3 Encounters:  07/18/17 97.8 F (36.6 C) (Oral)  07/10/17 98.7 F (37.1 C) (Oral)  03/27/17 98.3 F (36.8 C) (Oral)   BP Readings from Last 3 Encounters:  07/18/17 122/81  07/10/17 116/75  03/27/17 132/75   Pulse Readings from Last 3 Encounters:  07/18/17 82  07/10/17 77  03/27/17 69   Pain Assessment Pain Score: 2 (  Biopsy site is sore)/10  In general this is a well appearing caucasian female in no acute distress. She is alert and oriented x4 and appropriate throughout the examination. HEENT reveals that the patient is normocephalic, atraumatic. EOMs are intact. Skin is intact without any evidence of gross lesions. Cardiopulmonary assessment is negative for acute distress and she exhibits normal effort. Breast exam is deferred.   ECOG = 1  0 - Asymptomatic (Fully active, able to carry on all predisease activities without restriction)  1 - Symptomatic but completely ambulatory (Restricted in physically strenuous activity but ambulatory and able to  carry out work of a light or sedentary nature. For example, light housework, office work)  2 - Symptomatic, <50% in bed during the day (Ambulatory and capable of all self care but unable to carry out any work activities. Up and about more than 50% of waking hours)  3 - Symptomatic, >50% in bed, but not bedbound (Capable of only limited self-care, confined to bed or chair 50% or more of waking hours)  4 - Bedbound (Completely disabled. Cannot carry on any self-care. Totally confined to bed or chair)  5 - Death   Eustace Pen MM, Creech RH, Tormey DC, et al. [phone]). "Toxicity and response criteria of the [facility]". [place] Oncol. 5 (6): 649-55    LABORATORY DATA:  Lab Results  Component Value Date   WBC 3.9 07/10/2017   HGB 13.1 07/10/2017   HCT 38.8 07/10/2017   MCV 89.5 07/10/2017   PLT 222 07/10/2017   Lab Results  Component Value Date   NA 138 07/21/2014   K 4.1 07/21/2014   CL 104 07/21/2014   CO2 21 07/21/2014   Lab Results  Component Value Date   ALT 12 07/21/2014   AST 17 07/21/2014   ALKPHOS 55 07/21/2014   BILITOT 1.3 (H) 07/21/2014      RADIOGRAPHY: US Breast Ltd Uni Left Inc Axilla  Result Date: 07/03/2017 CLINICAL DATA:  Patient presents for bilateral diagnostic examination due to a palpable abnormality for 1 month over the inner upper left breast. EXAM: DIGITAL DIAGNOSTIC bilateral MAMMOGRAM WITH CAD AND TOMO ULTRASOUND left BREAST COMPARISON:  Previous exam(s). ACR Breast Density Category c: The breast tissue is heterogeneously dense, which may obscure small masses. FINDINGS: Examination demonstrates a 2 cm spiculated mass with a few associated microcalcifications over the inner upper left breast. Suggestion of subtle nipple retraction and skin thickening in the left periareolar region. There are a few microcalcifications just anteromedial and inferior to the mass. Remainder of the exam is unremarkable. Mammographic images were processed  with CAD. Targeted ultrasound is performed, showing a hypoechoic mass with irregular shape and margins at the 10 o'clock position of the left breast 3 cm from the nipple measuring 1.6 x 1.8 x 2.5 cm. There are several internal microcalcifications present. Ultrasound of the left axilla demonstrates no abnormal lymph nodes. IMPRESSION: Suspicious mass 10 o'clock position left breast 3 cm from the nipple measuring 1.6 x 1.8 x 2.5 cm accounting for patient's palpable abnormality. RECOMMENDATION: Recommend ultrasound-guided core needle biopsy for further evaluation. I have discussed the findings and recommendations with the patient. Results were also provided in writing at the conclusion of the visit. If applicable, a reminder letter will be sent to the patient regarding the next appointment. BI-RADS CATEGORY  5: Highly suggestive of malignancy. Biopsy to be scheduled here at the [facility] prior to patient's departure. Electronically Signed   By: Marin Olp  M.D.   On: 07/03/2017 11:07   Mm Diag Breast Tomo Bilateral  Result Date: 07/03/2017 CLINICAL DATA:  Patient presents for bilateral diagnostic examination due to a palpable abnormality for 1 month over the inner upper left breast. EXAM: DIGITAL DIAGNOSTIC bilateral MAMMOGRAM WITH CAD AND TOMO ULTRASOUND left BREAST COMPARISON:  Previous exam(s). ACR Breast Density Category c: The breast tissue is heterogeneously dense, which may obscure small masses. FINDINGS: Examination demonstrates a 2 cm spiculated mass with a few associated microcalcifications over the inner upper left breast. Suggestion of subtle nipple retraction and skin thickening in the left periareolar region. There are a few microcalcifications just anteromedial and inferior to the mass. Remainder of the exam is unremarkable. Mammographic images were processed with CAD. Targeted ultrasound is performed, showing a hypoechoic mass with irregular shape and margins at the 10 o'clock position of the  left breast 3 cm from the nipple measuring 1.6 x 1.8 x 2.5 cm. There are several internal microcalcifications present. Ultrasound of the left axilla demonstrates no abnormal lymph nodes. IMPRESSION: Suspicious mass 10 o'clock position left breast 3 cm from the nipple measuring 1.6 x 1.8 x 2.5 cm accounting for patient's palpable abnormality. RECOMMENDATION: Recommend ultrasound-guided core needle biopsy for further evaluation. I have discussed the findings and recommendations with the patient. Results were also provided in writing at the conclusion of the visit. If applicable, a reminder letter will be sent to the patient regarding the next appointment. BI-RADS CATEGORY  5: Highly suggestive of malignancy. Biopsy to be scheduled here at the [facility] prior to patient's departure. Electronically Signed   By: Marin Olp M.D.   On: 07/03/2017 11:07   Mm Clip Placement Left  Result Date: 07/09/2017 CLINICAL DATA:  Status post ultrasound-guided core biopsy of a mass in the left breast. EXAM: DIAGNOSTIC LEFT MAMMOGRAM POST ULTRASOUND BIOPSY COMPARISON:  Previous exam(s). FINDINGS: Mammographic images were obtained following ultrasound guided biopsy of a mass in the upper-inner quadrant of the left breast. Mammographic images show there is a ribbon shaped clip in appropriate position in the upper-inner quadrant of the left breast. IMPRESSION: Status post ultrasound-guided core biopsy of a left breast mass with pathology pending. Final Assessment: Post Procedure Mammograms for Marker Placement Electronically Signed   By: Lillia Mountain M.D.   On: 07/09/2017 14:40   [place] Lt Breast Bx W Loc Dev 1st Lesion Img Bx Spec US Guide  Addendum Date: 07/10/2017   ADDENDUM REPORT: 07/10/2017 14:53 ADDENDUM: Pathology revealed GRADE II INVASIVE DUCTAL CARCINOMA of the Left breast, 10 o'clock, 3 cmfn. This was found to be concordant by Dr. Lillia Mountain. Pathology results were discussed with the patient by telephone. The patient  reported doing well after the biopsy with tenderness at the site. Post biopsy instructions and care were reviewed and questions were answered. The patient was encouraged to call [facility] for any additional concerns. Surgical consultation has been arranged with Dr. Autumn Messing at [facility] on Jul 12, 2017. Dr. Zola Button at [facility] was notified of biopsy results via EPIC message on Jul 10, 2017. Pathology results reported by Terie Purser, RN on 07/10/2017. Electronically Signed   By: Lillia Mountain M.D.   On: 07/10/2017 14:53   Result Date: 07/10/2017 CLINICAL DATA:  Left breast mass. EXAM: ULTRASOUND GUIDED LEFT BREAST CORE NEEDLE BIOPSY COMPARISON:  Previous exam(s). FINDINGS: I met with the patient and we discussed the procedure of ultrasound-guided biopsy, including benefits and alternatives.  We discussed the high likelihood of a successful procedure. We discussed the risks of the procedure, including infection, bleeding, tissue injury, clip migration, and inadequate sampling. Informed written consent was given. The usual time-out protocol was performed immediately prior to the procedure. Lesion quadrant: Upper inner quadrant. Using sterile technique and 1% Lidocaine as local anesthetic, under direct ultrasound visualization, a 14 gauge spring-loaded device was used to perform biopsy of a mass in the 10 o'clock region of the left breast using a inferior to superior approach. At the conclusion of the procedure a ribbon shaped tissue marker clip was deployed into the biopsy cavity. Follow up 2 view mammogram was performed and dictated separately. IMPRESSION: Ultrasound guided biopsy of a mass in the 10 o'clock region of the left breast. No apparent complications. Electronically Signed: By: Lillia Mountain M.D. On: 07/09/2017 14:16       IMPRESSION/PLAN: 1. 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 Dr. Lisbeth Renshaw  discusses the pathology findings and reviews the nature of invasive left breast disease. The consensus from the breast conference includes proceeding with breast MRI due to concerns of her skin thickening, as well as proceeding with genetic testing. She may still be a candidate for breast conservation with lumpectomy with sentinel node biopsy. She did mention though she would consider risk reducing mastectomies bilaterally if she had a genetic predisposition to malignancy. The tumor will  be tested for Mammaprin score to determine a role for systemic therapy. Provided that chemotherapy is not indicated, the patient's course would then be followed by external radiotherapy to the breast if she had lumpectomy followed by antiestrogen therapy. If she had mastectomies based on what we know today we would not anticipate a role for radiotherapy, though we will follow up with this at conference discussion. We discussed the risks, benefits, short, and long term effects of radiotherapy, and the patient is interested in proceeding. Dr. Lisbeth Renshaw discusses the delivery and logistics of radiotherapy and anticipates a course of 6 1/2 weeks of radiotherapy if we proceeded in this route. She is in agreement with this plan. 2. Possible genetic predisposition to malignancy. The patient is a candidate for genetic testing given her personal and family history. She was offered referral and she is interested and will be seen tomorrow.     The above documentation reflects my direct findings during this shared patient visit. Please see the separate note by Dr. Lisbeth Renshaw on this date for the remainder of the patient's plan of care.    Carola Rhine, PAC

## 2017-07-19 ENCOUNTER — Inpatient Hospital Stay: Payer: POS

## 2017-07-19 ENCOUNTER — Encounter: Payer: Self-pay | Admitting: Genetic Counselor

## 2017-07-19 ENCOUNTER — Inpatient Hospital Stay (HOSPITAL_BASED_OUTPATIENT_CLINIC_OR_DEPARTMENT_OTHER): Payer: POS | Admitting: Genetic Counselor

## 2017-07-19 DIAGNOSIS — Z808 Family history of malignant neoplasm of other organs or systems: Secondary | ICD-10-CM | POA: Diagnosis not present

## 2017-07-19 DIAGNOSIS — Z170 Estrogen receptor positive status [ER+]: Secondary | ICD-10-CM | POA: Diagnosis not present

## 2017-07-19 DIAGNOSIS — Z803 Family history of malignant neoplasm of breast: Secondary | ICD-10-CM | POA: Diagnosis not present

## 2017-07-19 DIAGNOSIS — C50212 Malignant neoplasm of upper-inner quadrant of left female breast: Secondary | ICD-10-CM

## 2017-07-19 NOTE — Progress Notes (Signed)
REFERRING PROVIDER: Jovita Kussmaul, [address], [place] [address]  PRIMARY PROVIDER:  Patient, No Pcp Per  PRIMARY REASON FOR VISIT:  1. Malignant neoplasm of upper-inner quadrant of left breast in female, estrogen receptor positive ([facility])   2. Family history of breast cancer   3. Family history of brain cancer      HISTORY OF PRESENT ILLNESS:   Ms. Barse, a 52 y.o. female, was seen for a  cancer genetics consultation at the request of Dr. Marlou Starks due to a personal and family history of cancer.  Ms. Novell presents to clinic today to discuss the possibility of a hereditary predisposition to cancer, genetic testing, and to further clarify her future cancer risks, as well as potential cancer risks for family members.   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 This will be treated with surgery.  She has a history of hashimoto's thyroiditis, and several family members including her son, sister and several cousins who have autoimmune diseases.     CANCER HISTORY:   No history exists.     HORMONAL RISK FACTORS:  Menarche was at age 35.  First live birth at age 33.  OCP use for approximately 0 years.  Ovaries intact: yes.  Hysterectomy: no.  Menopausal status: premenopausal.  HRT use: 0 years. Colonoscopy: no; not examined. Mammogram within the last year: yes. Number of breast biopsies: 1. Up to date with pelvic exams:  yes. Any excessive radiation exposure in the past:  no  Past Medical History:  Diagnosis Date  . Family history of brain cancer   . Family history of breast cancer   . Thyroid disease     Past Surgical History:  Procedure Laterality Date  . WISDOM TOOTH EXTRACTION      Social History   Socioeconomic History  . Marital status: Married    Spouse name: Not on file  . Number of children: 5  . Years of education: Not on file  . Highest education level: Not on file    Occupational History  . Not on file  Social Needs  . Financial resource strain: Not on file  . Food insecurity:    Worry: Not on file    Inability: Not on file  . Transportation needs:    Medical: Not on file    Non-medical: Not on file  Tobacco Use  . Smoking status: Never Smoker  . Smokeless tobacco: Never Used  Substance and Sexual Activity  . Alcohol use: No    Alcohol/week: 0.0 oz  . Drug use: Never  . Sexual activity: Yes  Lifestyle  . Physical activity:    Days per week: Not on file    Minutes per session: Not on file  . Stress: Not on file  Relationships  . Social connections:    Talks on phone: Not on file    Gets together: Not on file    Attends religious service: Not on file    Active member of club or organization: Not on file    Attends meetings of clubs or organizations: Not on file    Relationship status: Not on file  Other Topics Concern  . Not on file  Social History Narrative  . Not on file     FAMILY HISTORY:  We obtained a detailed, 4-generation family history.  Significant diagnoses are listed below: Family History  Problem Relation Age of Onset  .  Stroke Mother   . Breast cancer Mother   . Bladder Cancer Father   . Heart disease Maternal Grandmother        d. 39  . Brain cancer Maternal Grandfather        d. in his 52s  . Hypothyroidism Sister   . Dementia Paternal Aunt   . Breast cancer Cousin 42       mat first cousin  . Hashimoto's thyroiditis Cousin   . Lupus Cousin        mat first cousin  . Hashimoto's thyroiditis Son 12    The patient has six children, five boys and a girl, all are cancer free.  She has a brother and a sister who are cancer free.  Both parents are living at age 14.  The patient's mother was diagnosed with breast cancer over age 64.  She ha two sisters and two brothers, all cancer free.  One brother has a daughter who has breast cancer.  Both maternal grandparents are deceased.  The grandfather had brain  cancer.  He was one of 7 children, and his siblings did not have cancer.  The patient's father has bladder cancer diagnosed around age 45.  He was one of 12 children, none of his siblings are known to have cancer.  His youngest sister has dementia.  Both paternal grandparents are deceased from non cancer related issues.  Ms. Brossard is unaware of previous family history of genetic testing for hereditary cancer risks. Patient's maternal ancestors are of [place] descent, and paternal ancestors are of [place] descent. There is no reported Ashkenazi Jewish ancestry. There is no known consanguinity.  GENETIC COUNSELING ASSESSMENT: PRUDENCE HEINY is a 52 y.o. female with a personal and family history of breast cancer which is somewhat suggestive of a hereditary cancer syndrome and predisposition to cancer. We, therefore, discussed and recommended the following at today's visit.   DISCUSSION: We discussed that about 5-10% of breast cancer is hereditary with most cases due to BRCA mutations. Other genes associated with hereditary cancer syndromes include ATM, CHEK2 and PALB2.  Based on the ages of onset in the family, the chances of her breast cancer being due to a hereditary cause is low.  We reviewed the characteristics, features and inheritance patterns of hereditary cancer syndromes. We also discussed genetic testing, including the appropriate family members to test, the process of testing, insurance coverage and turn-around-time for results. We discussed the implications of a negative, positive and/or variant of uncertain significant result. In order to get genetic test results in a timely manner so that Ms. Petitjean can use these genetic test results for surgical decisions, we recommended Ms. Cuddeback pursue genetic testing for the [facility] panel. If this test is negative, we then recommend Ms. Macfarlane pursue reflex genetic testing to the CancerNext gene panel. The CancerNext gene panel offered by  [employer] includes sequencing and rearrangement analysis for the following 34 genes:   APC, ATM, BARD1, BMPR1A, BRCA1, BRCA2, BRIP1, CDH1, CDK4, CDKN2A, CHEK2, DICER1, HOXB13, EPCAM, GREM1, MLH1, MRE11A, MSH2, MSH6, MUTYH, NBN, NF1, PALB2, PMS2, POLD1, POLE, PTEN, RAD50, RAD51C, RAD51D, SMAD4, SMARCA4, STK11, and TP53.    Based on Ms. Koci's personal and family history of cancer, she meets medical criteria for genetic testing. Despite that she meets criteria, she may still have an out of pocket cost. We discussed that if her out of pocket cost for testing is over $100, the laboratory will call and confirm whether she wants  to proceed with testing.  If the out of pocket cost of testing is less than $100 she will be billed by the genetic testing laboratory.   In order to estimate her chance of having a BRCA mutation, we used statistical models (Penn II) and laboratory data that take into account her personal medical history, family history and ancestry.  Because each model is different, there can be a lot of variability in the risks they give.  Therefore, these numbers must be considered a rough range and not a precise risk of having a BRCA mutation.  These models estimate that she has approximately a 9% chance of having a mutation. Based on this assessment of her family and personal history, genetic testing is recommended.   PLAN: After considering the risks, benefits, and limitations, Ms. Ormiston  provided informed consent to pursue genetic testing and the blood sample was sent to [facility] for analysis of the BRCAPlus, reflexing to the CancerNext panel. Results should be available within approximately 7-10 days, with an additional 2-3 weeks' time for the CancerNext panel, at which point they will be disclosed by telephone to Ms. Santarelli, as will any additional recommendations warranted by these results. Ms. Meyering will receive a summary of her genetic counseling visit and a copy of her  results once available. This information will also be available in Epic. We encouraged Ms. Fass to remain in contact with cancer genetics annually so that we can continuously update the family history and inform her of any changes in cancer genetics and testing that may be of benefit for her family. Ms. Reichow questions were answered to her satisfaction today. Our contact information was provided should additional questions or concerns arise.  Lastly, we encouraged Ms. Vanzee to remain in contact with cancer genetics annually so that we can continuously update the family history and inform her of any changes in cancer genetics and testing that may be of benefit for this family.   Ms.  Sisler questions were answered to her satisfaction today. Our contact information was provided should additional questions or concerns arise. Thank you for the referral and allowing [place] to share in the care of your patient.   Karen P. Florene Glen, [place], [facility] Certified Genetic Counselor [email or web address] phone: [phone]  The patient was seen for a total of 45 minutes in face-to-face genetic counseling.  This patient was discussed with Drs. Magrinat, Lindi Adie and/or Burr Medico who agrees with the above.    _______________________________________________________________________ For Office Staff:  Number of people involved in session: 1 Was an Intern/ student involved with case: no

## 2017-07-24 ENCOUNTER — Encounter: Payer: Self-pay | Admitting: *Deleted

## 2017-07-24 ENCOUNTER — Inpatient Hospital Stay (HOSPITAL_BASED_OUTPATIENT_CLINIC_OR_DEPARTMENT_OTHER): Payer: POS | Admitting: Hematology and Oncology

## 2017-07-24 DIAGNOSIS — C50212 Malignant neoplasm of upper-inner quadrant of left female breast: Secondary | ICD-10-CM | POA: Diagnosis not present

## 2017-07-24 DIAGNOSIS — Z170 Estrogen receptor positive status [ER+]: Secondary | ICD-10-CM

## 2017-07-24 DIAGNOSIS — D500 Iron deficiency anemia secondary to blood loss (chronic): Secondary | ICD-10-CM | POA: Diagnosis not present

## 2017-07-24 MED ORDER — POLYSACCHARIDE IRON COMPLEX 50 MG PO CAPS: 50.0000 mg | ORAL_CAPSULE | Freq: Every day | ORAL | Status: DC

## 2017-07-24 MED ORDER — B COMPLEX VITAMINS PO CAPS: 1.0000 | ORAL_CAPSULE | Freq: Every day | ORAL | Status: DC

## 2017-07-24 NOTE — Addendum Note (Signed)
Addended by: Nicholas Lose on: 07/24/2017 02:17 PM   Modules accepted: Level of Service

## 2017-07-24 NOTE — Assessment & Plan Note (Signed)
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 After reviewing the pathology in detail, we proceeded to discuss the different treatment options between surgery, radiation, chemotherapy, antiestrogen therapies.  Recommendations: 1. Breast conserving surgery followed by 2. Oncotype DX testing to determine if chemotherapy would be of any benefit followed by 3. Adjuvant radiation therapy followed by 4. Adjuvant antiestrogen therapy  Oncotype counseling: I discussed Oncotype DX test. I explained to the patient that this is a 21 gene panel to evaluate patient tumors DNA to calculate recurrence score. This would help determine whether patient has high risk or intermediate risk or low risk breast cancer. She understands that if her tumor was found to be high risk, she would benefit from systemic chemotherapy. If low risk, no need of chemotherapy. If she was found to be intermediate risk, we would need to evaluate the score as well as other risk factors and determine if an abbreviated chemotherapy may be of benefit.  Return to clinic after surgery to discuss final pathology report

## 2017-07-24 NOTE — Progress Notes (Signed)
[facility] CONSULT NOTE  Patient Care Team: Patient, No Pcp Per as PCP - General (General Practice)  CHIEF COMPLAINTS/PURPOSE OF CONSULTATION:  Newly diagnosed breast cancer  HISTORY OF PRESENTING ILLNESS:  Gloria Kramer 52 y.o. female is here because of recent diagnosis of left breast cancer.  Patient felt a lump in the left breast.  This led to a mammogram and ultrasound that revealed a 2.5 cm mass in the left breast 3 cm from the nipple.  Biopsy of which revealed that it was a grade 2 invasive ductal carcinoma that is ER PR positive HER-2 negative with a Ki-67 of 15%.  She was seen by surgery who recommended lumpectomy and she was referred to [place] for discussion regarding adjuvant treatment options. She has a prior history of Hashimoto's thyroiditis.  She is on thyroid replacement therapy.  She had genetic testing done and the results are pending. I reviewed her records extensively and collaborated the history with the patient.  SUMMARY OF ONCOLOGIC HISTORY:   Malignant neoplasm of upper-inner quadrant of left breast in female, estrogen receptor positive ([facility])   07/09/2017 Initial Diagnosis    Palpable mass left breast 3 cm from the nipple measuring 1.6 x 1.8 x 2.5 cm, biopsy revealed grade 2 IDC ER 95%, PR 90%, Ki-67 15%, HER-2 negative ratio 1.26, T2 N0 stage Ib clinical stage AJCC 8      MEDICAL HISTORY:  Past Medical History:  Diagnosis Date  . Family history of brain cancer   . Family history of breast cancer   . Thyroid disease     SURGICAL HISTORY: Past Surgical History:  Procedure Laterality Date  . WISDOM TOOTH EXTRACTION      SOCIAL HISTORY: Social History   Socioeconomic History  . Marital status: Married    Spouse name: Not on file  . Number of children: 5  . Years of education: Not on file  . Highest education level: Not on file  Occupational History  . Not on file  Social Needs  . Financial resource strain: Not on file  . Food insecurity:     Worry: Not on file    Inability: Not on file  . Transportation needs:    Medical: Not on file    Non-medical: Not on file  Tobacco Use  . Smoking status: Never Smoker  . Smokeless tobacco: Never Used  Substance and Sexual Activity  . Alcohol use: No    Alcohol/week: 0.0 oz  . Drug use: Never  . Sexual activity: Yes  Lifestyle  . Physical activity:    Days per week: Not on file    Minutes per session: Not on file  . Stress: Not on file  Relationships  . Social connections:    Talks on phone: Not on file    Gets together: Not on file    Attends religious service: Not on file    Active member of club or organization: Not on file    Attends meetings of clubs or organizations: Not on file    Relationship status: Not on file  . Intimate partner violence:    Fear of current or ex partner: Not on file    Emotionally abused: Not on file    Physically abused: Not on file    Forced sexual activity: Not on file  Other Topics Concern  . Not on file  Social History Narrative  . Not on file    FAMILY HISTORY: Family History  Problem Relation Age of  Onset  . Stroke Mother   . Breast cancer Mother   . Bladder Cancer Father   . Heart disease Maternal Grandmother        d. 44  . Brain cancer Maternal Grandfather        d. in his 51s  . Hypothyroidism Sister   . Dementia Paternal Aunt   . Breast cancer Cousin 53       mat first cousin  . Hashimoto's thyroiditis Cousin   . Lupus Cousin        mat first cousin  . Hashimoto's thyroiditis Son 12    ALLERGIES:  is allergic to penicillins.  MEDICATIONS:  Current Outpatient Medications  Medication Sig Dispense Refill  . b complex vitamins capsule Take 1 capsule by mouth daily.    . Polysaccharide Iron Complex (NOVAFERRUM 50) 50 MG CAPS Take 50 mg by mouth daily. 90 capsule   . SYNTHROID 125 MCG tablet      No current facility-administered medications for this visit.     REVIEW OF SYSTEMS:   Constitutional: Denies  fevers, chills or abnormal night sweats Eyes: Denies blurriness of vision, double vision or watery eyes Ears, nose, mouth, throat, and face: Denies mucositis or sore throat Respiratory: Denies cough, dyspnea or wheezes Cardiovascular: Denies palpitation, chest discomfort or lower extremity swelling Gastrointestinal:  Denies nausea, heartburn or change in bowel habits Skin: Denies abnormal skin rashes Lymphatics: Denies new lymphadenopathy or easy bruising Neurological:Denies numbness, tingling or new weaknesses Behavioral/Psych: Mood is stable, no new changes  Breast: Palpable lump left breast All other systems were reviewed with the patient and are negative.  PHYSICAL EXAMINATION: ECOG PERFORMANCE STATUS: 1 - Symptomatic but completely ambulatory  Vitals:   07/24/17 1312  BP: 121/75  Pulse: 77  Resp: 20  Temp: 98.6 F (37 C)  SpO2: 100%   Filed Weights   07/24/17 1312  Weight: 125 lb 11.2 oz (57 kg)    GENERAL:alert, no distress and comfortable SKIN: skin color, texture, turgor are normal, no rashes or significant lesions EYES: normal, conjunctiva are pink and non-injected, sclera clear OROPHARYNX:no exudate, no erythema and lips, buccal mucosa, and tongue normal  NECK: supple, thyroid normal size, non-tender, without nodularity LYMPH:  no palpable lymphadenopathy in the cervical, axillary or inguinal LUNGS: clear to auscultation and percussion with normal breathing effort HEART: regular rate & rhythm and no murmurs and no lower extremity edema ABDOMEN:abdomen soft, non-tender and normal bowel sounds Musculoskeletal:no cyanosis of digits and no clubbing  PSYCH: alert & oriented x 3 with fluent speech NEURO: no focal motor/sensory deficits  LABORATORY DATA:  I have reviewed the data as listed Lab Results  Component Value Date   WBC 3.9 07/10/2017   HGB 13.1 07/10/2017   HCT 38.8 07/10/2017   MCV 89.5 07/10/2017   PLT 222 07/10/2017   Lab Results  Component Value  Date   NA 138 07/21/2014   K 4.1 07/21/2014   CL 104 07/21/2014   CO2 21 07/21/2014    RADIOGRAPHIC STUDIES: I have personally reviewed the radiological reports and agreed with the findings in the report.  ASSESSMENT AND PLAN:  Malignant neoplasm of upper-inner quadrant of left breast in female, estrogen receptor positive ([facility]) 07/09/2017:Palpable mass left breast 3 cm from the nipple measuring 1.6 x 1.8 x 2.5 cm, biopsy revealed grade 2 IDC ER 95%, PR 90%, Ki-67 15%, HER-2 negative ratio 1.26, T2 N0 stage Ib clinical stage AJCC 8  Pathology and radiology counseling:Discussed with the patient,  the details of pathology including the type of breast cancer,the clinical staging, the significance of ER, PR and HER-2/neu receptors and the implications for treatment. After reviewing the pathology in detail, we proceeded to discuss the different treatment options between surgery, radiation, chemotherapy, antiestrogen therapies.  Recommendations: 1. Breast conserving surgery followed by 2. Oncotype DX testing to determine if chemotherapy would be of any benefit followed by 3. Adjuvant radiation therapy followed by 4. Adjuvant antiestrogen therapy  Oncotype counseling: I discussed Oncotype DX test. I explained to the patient that this is a 21 gene panel to evaluate patient tumors DNA to calculate recurrence score. This would help determine whether patient has high risk or intermediate risk or low risk breast cancer. She understands that if her tumor was found to be high risk, she would benefit from systemic chemotherapy. If low risk, no need of chemotherapy. If she was found to be intermediate risk, we would need to evaluate the score as well as other risk factors and determine if an abbreviated chemotherapy may be of benefit.  Return to clinic after surgery to discuss final pathology report   All questions were answered. The patient knows to call the clinic with any problems, questions or concerns.     Harriette Ohara, MD 07/24/17

## 2017-07-26 ENCOUNTER — Encounter: Payer: Self-pay | Admitting: General Practice

## 2017-07-26 NOTE — Progress Notes (Signed)
[facility] Psychosocial Distress Screening Clinical Social Work  Clinical Social Work was referred by distress screening protocol.  The patient scored a 5 on the Psychosocial Distress Thermometer which indicates moderate distress. Clinical Social Worker contacted patient by phone to assess for distress and other psychosocial needs. Unable to reach patient by phone, VM left w details of [place] and contact information for CSW.  Will await call back from patient if desired.  ONCBCN DISTRESS SCREENING 07/18/2017  Screening Type Initial Screening  Distress experienced in past week (1-10) 5  Emotional problem type Adjusting to illness    Clinical Social Worker follow up needed: No.  If yes, follow up plan:  Edwyna Shell, LCSW Clinical Social Worker Phone:  [phone]

## 2017-07-27 ENCOUNTER — Telehealth: Payer: Self-pay | Admitting: General Practice

## 2017-07-27 NOTE — Telephone Encounter (Signed)
[facility] CSW Progress Note  Patient called in response to CSW initial call to review distress screen.  Call today to attempt to reach patient, VM left requesting call back.  Edwyna Shell, LCSW Clinical Social Worker Phone:  [phone]

## 2017-07-28 DIAGNOSIS — C801 Malignant (primary) neoplasm, unspecified: Secondary | ICD-10-CM

## 2017-07-28 HISTORY — PX: BREAST LUMPECTOMY: SHX2

## 2017-07-28 HISTORY — DX: Malignant (primary) neoplasm, unspecified: C80.1

## 2017-07-30 ENCOUNTER — Encounter: Payer: Self-pay | Admitting: Genetic Counselor

## 2017-07-30 ENCOUNTER — Telehealth: Payer: Self-pay | Admitting: Genetic Counselor

## 2017-07-30 DIAGNOSIS — Z1379 Encounter for other screening for genetic and chromosomal anomalies: Secondary | ICD-10-CM | POA: Insufficient documentation

## 2017-07-30 NOTE — Telephone Encounter (Signed)
Revealed negative genetic testing.  Discussed that we do not know why she has breast cancer or why there is cancer in the family. It could be due to a different gene that we are not testing, or maybe our current technology may not be able to pick something up.  It will be important for her to keep in contact with genetics to keep up with whether additional testing may be needed.  Revealed CHEK2 VUS.  Discussed that this is still a normal result and we will not change medical management.

## 2017-07-31 ENCOUNTER — Ambulatory Visit: Payer: Self-pay | Admitting: Genetic Counselor

## 2017-07-31 ENCOUNTER — Other Ambulatory Visit (HOSPITAL_COMMUNITY): Payer: Self-pay | Admitting: General Surgery

## 2017-07-31 ENCOUNTER — Other Ambulatory Visit: Payer: Self-pay | Admitting: General Surgery

## 2017-07-31 ENCOUNTER — Encounter: Payer: Self-pay | Admitting: Genetic Counselor

## 2017-07-31 DIAGNOSIS — Z803 Family history of malignant neoplasm of breast: Secondary | ICD-10-CM

## 2017-07-31 DIAGNOSIS — C50212 Malignant neoplasm of upper-inner quadrant of left female breast: Secondary | ICD-10-CM

## 2017-07-31 DIAGNOSIS — Z170 Estrogen receptor positive status [ER+]: Secondary | ICD-10-CM

## 2017-07-31 DIAGNOSIS — Z808 Family history of malignant neoplasm of other organs or systems: Secondary | ICD-10-CM

## 2017-07-31 DIAGNOSIS — Z1379 Encounter for other screening for genetic and chromosomal anomalies: Secondary | ICD-10-CM

## 2017-07-31 NOTE — Progress Notes (Signed)
HPI:  [name] was previously seen in the [facility] due to a personal and family history of cancer and concerns regarding a hereditary predisposition to cancer. Please refer to our prior cancer genetics clinic note for more information regarding [name] medical, social and family histories, and our assessment and recommendations, at the time. [name] were disclosed to her, as were recommendations warranted by these results. These results and recommendations are discussed in more detail below.  CANCER HISTORY:    Malignant neoplasm of upper-inner quadrant of left breast in female, estrogen receptor positive ([place])   07/09/2017 Initial Diagnosis    Palpable mass left breast 3 cm from the nipple measuring 1.6 x 1.8 x 2.5 cm, biopsy revealed grade 2 IDC ER 95%, PR 90%, Ki-67 15%, HER-2 negative ratio 1.26, T2 N0 stage Ib clinical stage AJCC 8      07/27/2017 Genetic Testing    CHEK2 p.M381V VUS identified on the CancerNext panel.  The CancerNext gene panel offered by [employer] includes sequencing and rearrangement analysis for the following 34 genes:   APC, ATM, BARD1, BMPR1A, BRCA1, BRCA2, BRIP1, CDH1, CDK4, CDKN2A, CHEK2, DICER1, HOXB13, EPCAM, GREM1, MLH1, MRE11A, MSH2, MSH6, MUTYH, NBN, NF1, PALB2, PMS2, POLD1, POLE, PTEN, RAD50, RAD51C, RAD51D, SMAD4, SMARCA4, STK11, and TP53.  The report date is Jul 27, 2017.       FAMILY HISTORY:  We obtained a detailed, 4-generation family history.  Significant diagnoses are listed below: Family History  Problem Relation Age of Onset  . Stroke Mother   . Breast cancer Mother   . Bladder Cancer Father   . Heart disease Maternal Grandmother        d. 44  . Brain cancer Maternal Grandfather        d. in his 53s  . Hypothyroidism Sister   . Dementia Paternal Aunt   . Breast cancer Cousin 27       mat first cousin  . Hashimoto's thyroiditis Cousin   . Lupus Cousin        mat first  cousin  . Hashimoto's thyroiditis Son 12    The patient has six children, five boys and a girl, all are cancer free.  She has a brother and a sister who are cancer free.  Both parents are living at age 74.  The patient's mother was diagnosed with breast cancer over age 10.  She ha two sisters and two brothers, all cancer free.  One brother has a daughter who has breast cancer.  Both maternal grandparents are deceased.  The grandfather had brain cancer.  He was one of 7 children, and his siblings did not have cancer.  The patient's father has bladder cancer diagnosed around age 49.  He was one of 12 children, none of his siblings are known to have cancer.  His youngest sister has dementia.  Both paternal grandparents are deceased from non cancer related issues.  [name] is unaware of previous family history of genetic testing for hereditary cancer risks. Patient's maternal ancestors are of [place] descent, and paternal ancestors are of [place] descent. There is no reported Ashkenazi Jewish ancestry. There is no known consanguinity.  GENETIC TEST RESULTS: Genetic testing reported out on Jul 27, 2017 through the [facility] cancer panel found no deleterious mutations.  The CancerNext gene panel offered by [facility] includes sequencing and rearrangement analysis for the following 34 genes:   APC, ATM, BARD1, BMPR1A,  BRCA1, BRCA2, BRIP1, CDH1, CDK4, CDKN2A, CHEK2, DICER1, HOXB13, EPCAM, GREM1, MLH1, MRE11A, MSH2, MSH6, MUTYH, NBN, NF1, PALB2, PMS2, POLD1, POLE, PTEN, RAD50, RAD51C, RAD51D, SMAD4, SMARCA4, STK11, and TP53.  The test report has been scanned into EPIC and is located under the Molecular Pathology section of the Results Review tab.    We discussed with [name] that since the current genetic testing is not perfect, it is possible there may be a gene mutation in one of these genes that current testing cannot detect, but that chance is small.  We also  discussed, that it is possible that another gene that has not yet been discovered, or that we have not yet tested, is responsible for the cancer diagnoses in the family, and it is, therefore, important to remain in touch with cancer genetics in the future so that we can continue to offer [name] the most up to date genetic testing.   Genetic testing did detect a Variant of Unknown Significance in the CHEK2 gene called c.1141A>G (p.M381V). At this time, it is unknown if this variant is associated with increased cancer risk or if this is a normal finding, but most variants such as this get reclassified to being inconsequential. It should not be used to make medical management decisions. With time, we suspect the lab will determine the significance of this variant, if any. If we do learn more about it, we will try to contact [name] to discuss it further. However, it is important to stay in touch with [place] periodically and keep the address and phone number up to date.  CANCER SCREENING RECOMMENDATIONS:  This result is reassuring and indicates that [name] likely does not have an increased risk for a future cancer due to a mutation in one of these genes. This normal test also suggests that [name] cancer was most likely not due to an inherited predisposition associated with one of these genes.  Most cancers happen by chance and this negative test suggests that her cancer falls into this category.  We, therefore, recommended she continue to follow the cancer management and screening guidelines provided by her oncology and primary healthcare provider.   An individual's cancer risk and medical management are not determined by genetic test results alone. Overall cancer risk assessment incorporates additional factors, including personal medical history, family history, and any available genetic information that may result in a personalized plan for cancer prevention and surveillance.  RECOMMENDATIONS FOR  FAMILY MEMBERS:  Women in this family might be at some increased risk of developing cancer, over the general population risk, simply due to the family history of cancer.  We recommended women in this family have a yearly mammogram beginning at age 40, or 15 years younger than the earliest onset of cancer, an annual clinical breast exam, and perform monthly breast self-exams. Women in this family should also have a gynecological exam as recommended by their primary provider. All family members should have a colonoscopy by age 25.  FOLLOW-UP: Lastly, we discussed with [name] that cancer genetics is a rapidly advancing field and it is possible that new genetic tests will be appropriate for her and/or her family members in the future. We encouraged her to remain in contact with cancer genetics on an annual basis so we can update her personal and family histories and let her know of advances in cancer genetics that may benefit this family.   Our contact number was provided. [name] questions were answered to her  satisfaction, and she knows she is welcome to call us at anytime with additional questions or concerns.   Roma Kayser, MS, [facility] Certified Genetic Counselor [email or web address]

## 2017-08-04 ENCOUNTER — Ambulatory Visit (HOSPITAL_COMMUNITY)
Admission: RE | Admit: 2017-08-04 | Discharge: 2017-08-04 | Disposition: A | Discharge status: Home / Self Care | Payer: POS | Source: Ambulatory Visit | Attending: General Surgery | Admitting: General Surgery

## 2017-08-04 DIAGNOSIS — N6314 Unspecified lump in the right breast, lower inner quadrant: Secondary | ICD-10-CM | POA: Insufficient documentation

## 2017-08-04 DIAGNOSIS — C50212 Malignant neoplasm of upper-inner quadrant of left female breast: Secondary | ICD-10-CM

## 2017-08-04 MED ORDER — GADOBENATE DIMEGLUMINE 529 MG/ML IV SOLN
15.0000 mL | Freq: Once | INTRAVENOUS | Status: AC | PRN
  Administered 2017-08-04: 12 mL via INTRAVENOUS

## 2017-08-06 ENCOUNTER — Other Ambulatory Visit: Payer: Self-pay | Admitting: General Surgery

## 2017-08-06 DIAGNOSIS — R9389 Abnormal findings on diagnostic imaging of other specified body structures: Secondary | ICD-10-CM

## 2017-08-09 ENCOUNTER — Ambulatory Visit
Admission: RE | Admit: 2017-08-09 | Discharge: 2017-08-09 | Disposition: A | Discharge status: Home / Self Care | Payer: POS | Source: Ambulatory Visit | Attending: General Surgery | Admitting: General Surgery

## 2017-08-09 ENCOUNTER — Other Ambulatory Visit: Payer: Self-pay | Admitting: General Surgery

## 2017-08-09 DIAGNOSIS — R9389 Abnormal findings on diagnostic imaging of other specified body structures: Secondary | ICD-10-CM

## 2017-08-09 DIAGNOSIS — N6310 Unspecified lump in the right breast, unspecified quadrant: Secondary | ICD-10-CM

## 2017-08-09 HISTORY — PX: BREAST BIOPSY: SHX20

## 2017-08-10 ENCOUNTER — Ambulatory Visit: Payer: Self-pay | Admitting: General Surgery

## 2017-08-10 DIAGNOSIS — Z170 Estrogen receptor positive status [ER+]: Principal | ICD-10-CM

## 2017-08-10 DIAGNOSIS — C50212 Malignant neoplasm of upper-inner quadrant of left female breast: Secondary | ICD-10-CM

## 2017-08-15 ENCOUNTER — Encounter: Payer: Self-pay | Admitting: Family Medicine

## 2017-08-15 ENCOUNTER — Telehealth: Payer: Self-pay | Admitting: Hematology and Oncology

## 2017-08-15 ENCOUNTER — Ambulatory Visit: Payer: POS

## 2017-08-15 ENCOUNTER — Other Ambulatory Visit: Payer: Self-pay

## 2017-08-15 ENCOUNTER — Ambulatory Visit (INDEPENDENT_AMBULATORY_CARE_PROVIDER_SITE_OTHER): Payer: POS | Admitting: Family Medicine

## 2017-08-15 VITALS — BP 125/71 | HR 78 | Temp 98.0°F | Resp 16 | Ht 68.0 in | Wt 126.4 lb

## 2017-08-15 DIAGNOSIS — Z1383 Encounter for screening for respiratory disorder NEC: Secondary | ICD-10-CM | POA: Diagnosis not present

## 2017-08-15 DIAGNOSIS — Z136 Encounter for screening for cardiovascular disorders: Secondary | ICD-10-CM | POA: Diagnosis not present

## 2017-08-15 DIAGNOSIS — D500 Iron deficiency anemia secondary to blood loss (chronic): Secondary | ICD-10-CM | POA: Diagnosis not present

## 2017-08-15 DIAGNOSIS — E039 Hypothyroidism, unspecified: Secondary | ICD-10-CM | POA: Diagnosis not present

## 2017-08-15 DIAGNOSIS — Z113 Encounter for screening for infections with a predominantly sexual mode of transmission: Secondary | ICD-10-CM

## 2017-08-15 DIAGNOSIS — Z130 Encounter for screening for diseases of the blood and blood-forming organs and certain disorders involving the immune mechanism: Secondary | ICD-10-CM | POA: Diagnosis not present

## 2017-08-15 DIAGNOSIS — Z1389 Encounter for screening for other disorder: Secondary | ICD-10-CM

## 2017-08-15 DIAGNOSIS — C50212 Malignant neoplasm of upper-inner quadrant of left female breast: Secondary | ICD-10-CM | POA: Diagnosis not present

## 2017-08-15 DIAGNOSIS — Z170 Estrogen receptor positive status [ER+]: Secondary | ICD-10-CM

## 2017-08-15 DIAGNOSIS — R0989 Other specified symptoms and signs involving the circulatory and respiratory systems: Secondary | ICD-10-CM | POA: Diagnosis not present

## 2017-08-15 DIAGNOSIS — Z0000 Encounter for general adult medical examination without abnormal findings: Secondary | ICD-10-CM

## 2017-08-15 LAB — POCT URINALYSIS DIP (MANUAL ENTRY)
BILIRUBIN UA: NEGATIVE mg/dL
Bilirubin, UA: NEGATIVE
Glucose, UA: NEGATIVE mg/dL
Leukocytes, UA: NEGATIVE
Nitrite, UA: NEGATIVE
PROTEIN UA: NEGATIVE mg/dL
SPEC GRAV UA: 1.02 (ref 1.010–1.025)
Urobilinogen, UA: 0.2 E.U./dL
pH, UA: 6.5 (ref 5.0–8.0)

## 2017-08-15 MED ORDER — ALBUTEROL SULFATE (2.5 MG/3ML) 0.083% IN NEBU: 2.5000 mg | INHALATION_SOLUTION | Freq: Once | RESPIRATORY_TRACT | Status: DC

## 2017-08-15 NOTE — Telephone Encounter (Signed)
Mailed patient calendar of upcoming July appointments per 6/18 sch message.

## 2017-08-15 NOTE — Patient Instructions (Addendum)
Please sign a release for your records from Dr. Ernie Hew and Dr. Corinna Capra esp labs.    IF you received an x-ray today, you will receive an invoice from [facility]. Please contact [facility] at [phone] with questions or concerns regarding your invoice.   IF you received labwork today, you will receive an invoice from [facility]. Please contact [facility] at [phone] with questions or concerns regarding your invoice.   Our billing staff will not be able to assist you with questions regarding bills from these companies.  You will be contacted with the lab results as soon as they are available. The fastest way to get your results is to activate your My Chart account. Instructions are located on the last page of this paperwork. If you have not heard from [place] regarding the results in 2 weeks, please contact this office.      Hypothyroidism Hypothyroidism is a disorder of the thyroid. The thyroid is a large gland that is located in the lower front of the neck. The thyroid releases hormones that control how the body works. With hypothyroidism, the thyroid does not make enough of these hormones. What are the causes? Causes of hypothyroidism may include:  Viral infections.  Pregnancy.  Your own defense system (immune system) attacking your thyroid.  Certain medicines.  Birth defects.  Past radiation treatments to your head or neck.  Past treatment with radioactive iodine.  Past surgical removal of part or all of your thyroid.  Problems with the gland that is located in the center of your brain (pituitary).  What are the signs or symptoms? Signs and symptoms of hypothyroidism may include:  Feeling as though you have no energy (lethargy).  Inability to tolerate cold.  Weight gain that is not explained by a change in diet or exercise habits.  Dry skin.  Coarse hair.  Menstrual irregularity.  Slowing of thought processes.  Constipation.  Sadness or  depression.  How is this diagnosed? Your health care provider may diagnose hypothyroidism with blood tests and ultrasound tests. How is this treated? Hypothyroidism is treated with medicine that replaces the hormones that your body does not make. After you begin treatment, it may take several weeks for symptoms to go away. Follow these instructions at home:  Take medicines only as directed by your health care provider.  If you start taking any new medicines, tell your health care provider.  Keep all follow-up visits as directed by your health care provider. This is important. As your condition improves, your dosage needs may change. You will need to have blood tests regularly so that your health care provider can watch your condition. Contact a health care provider if:  Your symptoms do not get better with treatment.  You are taking thyroid replacement medicine and: ? You sweat excessively. ? You have tremors. ? You feel anxious. ? You lose weight rapidly. ? You cannot tolerate heat. ? You have emotional swings. ? You have diarrhea. ? You feel weak. Get help right away if:  You develop chest pain.  You develop an irregular heartbeat.  You develop a rapid heartbeat. This information is not intended to replace advice given to you by your health care provider. Make sure you discuss any questions you have with your health care provider. Document Released: 02/13/2005 Document Revised: 07/22/2015 Document Reviewed: 07/01/2013 Elsevier Interactive Patient Education  2018 [place] Maintenance, Female Adopting a healthy lifestyle and getting preventive care can go a long way to promote health and wellness.  Talk with your health care provider about what schedule of regular examinations is right for you. This is a good chance for you to check in with your provider about disease prevention and staying healthy. In between checkups, there are plenty of things you can do on your  own. Experts have done a lot of research about which lifestyle changes and preventive measures are most likely to keep you healthy. Ask your health care provider for more information. Weight and diet Eat a healthy diet  Be sure to include plenty of vegetables, fruits, low-fat dairy products, and lean protein.  Do not eat a lot of foods high in solid fats, added sugars, or salt.  Get regular exercise. This is one of the most important things you can do for your health. ? Most adults should exercise for at least 150 minutes each week. The exercise should increase your heart rate and make you sweat (moderate-intensity exercise). ? Most adults should also do strengthening exercises at least twice a week. This is in addition to the moderate-intensity exercise.  Maintain a healthy weight  Body mass index (BMI) is a measurement that can be used to identify possible weight problems. It estimates body fat based on height and weight. Your health care provider can help determine your BMI and help you achieve or maintain a healthy weight.  For females 34 years of age and older: ? A BMI below 18.5 is considered underweight. ? A BMI of 18.5 to 24.9 is normal. ? A BMI of 25 to 29.9 is considered overweight. ? A BMI of 30 and above is considered obese.  Watch levels of cholesterol and blood lipids  You should start having your blood tested for lipids and cholesterol at 50 years of age, then have this test every 5 years.  You may need to have your cholesterol levels checked more often if: ? Your lipid or cholesterol levels are high. ? You are older than 50 years of age. ? You are at high risk for heart disease.  Cancer screening Lung Cancer  Lung cancer screening is recommended for adults 69-5 years old who are at high risk for lung cancer because of a history of smoking.  A yearly low-dose CT scan of the lungs is recommended for people who: ? Currently smoke. ? Have quit within the past 15  years. ? Have at least a 30-pack-year history of smoking. A pack year is smoking an average of one pack of cigarettes a day for 1 year.  Yearly screening should continue until it has been 15 years since you quit.  Yearly screening should stop if you develop a health problem that would prevent you from having lung cancer treatment.  Breast Cancer  Practice breast self-awareness. This means understanding how your breasts normally appear and feel.  It also means doing regular breast self-exams. Let your health care provider know about any changes, no matter how small.  If you are in your 20s or 30s, you should have a clinical breast exam (CBE) by a health care provider every 1-3 years as part of a regular health exam.  If you are 31 or older, have a CBE every year. Also consider having a breast X-ray (mammogram) every year.  If you have a family history of breast cancer, talk to your health care provider about genetic screening.  If you are at high risk for breast cancer, talk to your health care provider about having an MRI and a mammogram every year.  Breast  cancer gene (BRCA) assessment is recommended for women who have family members with BRCA-related cancers. BRCA-related cancers include: ? Breast. ? Ovarian. ? Tubal. ? Peritoneal cancers.  Results of the assessment will determine the need for genetic counseling and BRCA1 and BRCA2 testing.  Cervical Cancer Your health care provider may recommend that you be screened regularly for cancer of the pelvic organs (ovaries, uterus, and vagina). This screening involves a pelvic examination, including checking for microscopic changes to the surface of your cervix (Pap test). You may be encouraged to have this screening done every 3 years, beginning at age 17.  For women ages 9-65, health care providers may recommend pelvic exams and Pap testing every 3 years, or they may recommend the Pap and pelvic exam, combined with testing for human  papilloma virus (HPV), every 5 years. Some types of HPV increase your risk of cervical cancer. Testing for HPV may also be done on women of any age with unclear Pap test results.  Other health care providers may not recommend any screening for nonpregnant women who are considered low risk for pelvic cancer and who do not have symptoms. Ask your health care provider if a screening pelvic exam is right for you.  If you have had past treatment for cervical cancer or a condition that could lead to cancer, you need Pap tests and screening for cancer for at least 20 years after your treatment. If Pap tests have been discontinued, your risk factors (such as having a new sexual partner) need to be reassessed to determine if screening should resume. Some women have medical problems that increase the chance of getting cervical cancer. In these cases, your health care provider may recommend more frequent screening and Pap tests.  Colorectal Cancer  This type of cancer can be detected and often prevented.  Routine colorectal cancer screening usually begins at 50 years of age and continues through 50 years of age.  Your health care provider may recommend screening at an earlier age if you have risk factors for colon cancer.  Your health care provider may also recommend using home test kits to check for hidden blood in the stool.  A small camera at the end of a tube can be used to examine your colon directly (sigmoidoscopy or colonoscopy). This is done to check for the earliest forms of colorectal cancer.  Routine screening usually begins at age 29.  Direct examination of the colon should be repeated every 5-10 years through 50 years of age. However, you may need to be screened more often if early forms of precancerous polyps or small growths are found.  Skin Cancer  Check your skin from head to toe regularly.  Tell your health care provider about any new moles or changes in moles, especially if there is  a change in a mole's shape or color.  Also tell your health care provider if you have a mole that is larger than the size of a pencil eraser.  Always use sunscreen. Apply sunscreen liberally and repeatedly throughout the day.  Protect yourself by wearing long sleeves, pants, a wide-brimmed hat, and sunglasses whenever you are outside.  Heart disease, diabetes, and high blood pressure  High blood pressure causes heart disease and increases the risk of stroke. High blood pressure is more likely to develop in: ? People who have blood pressure in the high end of the normal range (130-139/85-89 mm Hg). ? People who are overweight or obese. ? People who are African American.  If you are 31-40 years of age, have your blood pressure checked every 3-5 years. If you are 31 years of age or older, have your blood pressure checked every year. You should have your blood pressure measured twice-once when you are at a hospital or clinic, and once when you are not at a hospital or clinic. Record the average of the two measurements. To check your blood pressure when you are not at a hospital or clinic, you can use: ? An automated blood pressure machine at a pharmacy. ? A home blood pressure monitor.  If you are between 64 years and 77 years old, ask your health care provider if you should take aspirin to prevent strokes.  Have regular diabetes screenings. This involves taking a blood sample to check your fasting blood sugar level. ? If you are at a normal weight and have a low risk for diabetes, have this test once every three years after 50 years of age. ? If you are overweight and have a high risk for diabetes, consider being tested at a younger age or more often. Preventing infection Hepatitis B  If you have a higher risk for hepatitis B, you should be screened for this virus. You are considered at high risk for hepatitis B if: ? You were born in a country where hepatitis B is common. Ask your health  care provider which countries are considered high risk. ? Your parents were born in a high-risk country, and you have not been immunized against hepatitis B (hepatitis B vaccine). ? You have HIV or AIDS. ? You use needles to inject street drugs. ? You live with someone who has hepatitis B. ? You have had sex with someone who has hepatitis B. ? You get hemodialysis treatment. ? You take certain medicines for conditions, including cancer, organ transplantation, and autoimmune conditions.  Hepatitis C  Blood testing is recommended for: ? Everyone born from [birth year] through 1963. ? Anyone with known risk factors for hepatitis C.  Sexually transmitted infections (STIs)  You should be screened for sexually transmitted infections (STIs) including gonorrhea and chlamydia if: ? You are sexually active and are younger than 50 years of age. ? You are older than 50 years of age and your health care provider tells you that you are at risk for this type of infection. ? Your sexual activity has changed since you were last screened and you are at an increased risk for chlamydia or gonorrhea. Ask your health care provider if you are at risk.  If you do not have HIV, but are at risk, it may be recommended that you take a prescription medicine daily to prevent HIV infection. This is called pre-exposure prophylaxis (PrEP). You are considered at risk if: ? You are sexually active and do not regularly use condoms or know the HIV status of your partner(s). ? You take drugs by injection. ? You are sexually active with a partner who has HIV.  Talk with your health care provider about whether you are at high risk of being infected with HIV. If you choose to begin PrEP, you should first be tested for HIV. You should then be tested every 3 months for as long as you are taking PrEP. Pregnancy  If you are premenopausal and you may become pregnant, ask your health care provider about preconception counseling.  If you  may become pregnant, take 400 to 800 micrograms (mcg) of folic acid every day.  If you want to prevent pregnancy, talk  to your health care provider about birth control (contraception). Osteoporosis and menopause  Osteoporosis is a disease in which the bones lose minerals and strength with aging. This can result in serious bone fractures. Your risk for osteoporosis can be identified using a bone density scan.  If you are 23 years of age or older, or if you are at risk for osteoporosis and fractures, ask your health care provider if you should be screened.  Ask your health care provider whether you should take a calcium or vitamin D supplement to lower your risk for osteoporosis.  Menopause may have certain physical symptoms and risks.  Hormone replacement therapy may reduce some of these symptoms and risks. Talk to your health care provider about whether hormone replacement therapy is right for you. Follow these instructions at home:  Schedule regular health, dental, and eye exams.  Stay current with your immunizations.  Do not use any tobacco products including cigarettes, chewing tobacco, or electronic cigarettes.  If you are pregnant, do not drink alcohol.  If you are breastfeeding, limit how much and how often you drink alcohol.  Limit alcohol intake to no more than 1 drink per day for nonpregnant women. One drink equals 12 ounces of beer, 5 ounces of wine, or 1 ounces of hard liquor.  Do not use street drugs.  Do not share needles.  Ask your health care provider for help if you need support or information about quitting drugs.  Tell your health care provider if you often feel depressed.  Tell your health care provider if you have ever been abused or do not feel safe at home. This information is not intended to replace advice given to you by your health care provider. Make sure you discuss any questions you have with your health care provider. Document Released: 08/29/2010  Document Revised: 07/22/2015 Document Reviewed: 11/17/2014 Elsevier Interactive Patient Education  [employer].

## 2017-08-15 NOTE — Progress Notes (Signed)
Subjective:  By signing my name below, I, Essence Howell, attest that this documentation has been prepared under the direction and in the presence of Delman Cheadle, MD Electronically Signed: Ladene Artist, ED Scribe 08/15/2017 at 11:17 AM.   Patient ID: Gloria Kramer, female    DOB: Jul 08, 1965, 52 y.o.   MRN: [id number]  Chief Complaint  Patient presents with  . Annual Exam   HPI Gloria Kramer is a 52 y.o. female who presents to Primary Care at [facility] for an annual exam. Pt had a sugary drink this morning with her vitamins.  Primary Preventative Screenings: Cervical Cancer: Pap by OBGYN Dr. Louretta Shorten with [facility] Family Planning: Reports irregular periods. 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 Scheduled for lumpectomy on 7/1. Estrogen receptor positive in upper inner quadrant. Followed by Dr. Lindi Adie. Mother had breast CA. Colorectal Cancer: Tobacco use/EtOH/substances: Bone Density: Cardiac: EKG done today Weight/Blood sugar/Diet/Exercise: BMI Readings from Last 3 Encounters:  08/15/17 19.22 kg/m  07/24/17 19.11 kg/m  07/18/17 19.43 kg/m   No results found for: HGBA1C OTC/Vit/Supp/Herbal: Pt takes D3 1000. She also takes 2 50 iron tabs daily. Dentist/Optho: Immunizations:  Immunization History  Administered Date(s) Administered  . Influenza Split 11/28/2010  . Tdap 01/16/2011   Chronic Medical Conditions: H/o Hashimoto's Thyroiditis - Not currently followed by anyone. Prev synthroid prescribed by prev PCP Dr. Rachell Cipro. States she has gone from 100 to 125 mcg over the past few yrs. She would like to be on a lower dose due to occasional hair loss (although not quite as much as prior to transfusions). Denies changes in bowels.  H/o Iron Deficiency Anemia - Pt had 2 iron transfusions in Jan. She has noticed more energy and  increased endurance with exercising since. Denies cp, chest tightness, sob which she states that she experienced prior to transfusions. Followed by Dr. Alen Blew; f/u in Sept. No h/o asthma or allergies. Grandmother passed of CHF.  Last lipid panel: LDL 64, tot 132, trig 89, HDL 50.    Pt has 5 children.  Past Medical History:  Diagnosis Date  . Family history of brain cancer   . Family history of breast cancer   . Thyroid disease    Past Surgical History:  Procedure Laterality Date  . WISDOM TOOTH EXTRACTION     Current Outpatient Medications on File Prior to Visit  Medication Sig Dispense Refill  . b complex vitamins capsule Take 1 capsule by mouth daily.    . Polysaccharide Iron Complex (NOVAFERRUM 50) 50 MG CAPS Take 50 mg by mouth daily. 90 capsule   . SYNTHROID 125 MCG tablet      No current facility-administered medications on file prior to visit.    Allergies  Allergen Reactions  . Penicillins Other (See Comments)    Unsure reaction was when she was a child   Family History  Problem Relation Age of Onset  . Stroke Mother   . Breast cancer Mother   . Bladder Cancer Father   . Heart disease Maternal Grandmother        d. 7  . Brain cancer Maternal Grandfather        d. in his 76s  . Hypothyroidism Sister   . Dementia Paternal Aunt   . Breast cancer Cousin 60       mat first cousin  . Hashimoto's  thyroiditis Cousin   . Lupus Cousin        mat first cousin  . Hashimoto's thyroiditis Son 12   Social History   Socioeconomic History  . Marital status: Married    Spouse name: Not on file  . Number of children: 5  . Years of education: Not on file  . Highest education level: Not on file  Occupational History  . Not on file  Social Needs  . Financial resource strain: Not on file  . Food insecurity:    Worry: Not on file    Inability: Not on file  . Transportation needs:    Medical: Not on file    Non-medical: Not on file  Tobacco Use  . Smoking status:  Never Smoker  . Smokeless tobacco: Never Used  Substance and Sexual Activity  . Alcohol use: No    Alcohol/week: 0.0 oz  . Drug use: Never  . Sexual activity: Yes  Lifestyle  . Physical activity:    Days per week: Not on file    Minutes per session: Not on file  . Stress: Not on file  Relationships  . Social connections:    Talks on phone: Not on file    Gets together: Not on file    Attends religious service: Not on file    Active member of club or organization: Not on file    Attends meetings of clubs or organizations: Not on file    Relationship status: Not on file  Other Topics Concern  . Not on file  Social History Narrative  . Not on file   Depression screen [facility] 2/9 08/15/2017 07/18/2017 07/21/2014  Decreased Interest 0 0 0  Down, Depressed, Hopeless 0 0 0  PHQ - 2 Score 0 0 0   Review of Systems  Constitutional: Negative for fatigue.  Respiratory: Negative for chest tightness and shortness of breath.   Cardiovascular: Negative for chest pain.  Gastrointestinal: Negative for constipation and diarrhea.      Objective:   Physical Exam  Constitutional: She is oriented to person, place, and time. She appears well-developed and well-nourished. No distress.  HENT:  Head: Normocephalic and atraumatic.  Nose: Nose normal.  Mouth/Throat: Oropharynx is clear and moist.  Eyes: Conjunctivae and EOM are normal.  Neck: Neck supple. No tracheal deviation present.  Cardiovascular: Normal rate, regular rhythm and normal heart sounds.  Pulmonary/Chest: Effort normal and breath sounds normal. No respiratory distress.  Musculoskeletal: Normal range of motion.  Neurological: She is alert and oriented to person, place, and time.  Skin: Skin is warm and dry.  Psychiatric: She has a normal mood and affect. Her behavior is normal.  Nursing note and vitals reviewed.  BP 125/71   Pulse 78   Temp 98 F (36.7 C)   Resp 16   Ht 5\' 8"  (1.727 m)   Wt 126 lb 6.4 oz (57.3 kg)   SpO2  100%   PF 360 L/min   BMI 19.22 kg/m  Predicted PF: 663.  (I think pt's repeat peak flow after albuterol neb was not significantly changed at all but appears to not be recorded.)    Results for orders placed or performed in visit on 08/15/17  POCT urinalysis dipstick  Result Value Ref Range   Color, UA yellow yellow   Clarity, UA clear clear   Glucose, UA negative negative mg/dL   Bilirubin, UA negative negative   Ketones, POC UA negative negative mg/dL   Spec Grav, UA 1.020  1.010 - 1.025   Blood, UA trace-lysed (A) negative   pH, UA 6.5 5.0 - 8.0   Protein Ur, POC negative negative mg/dL   Urobilinogen, UA 0.2 0.2 or 1.0 E.U./dL   Nitrite, UA Negative Negative   Leukocytes, UA Negative Negative   EKG: NSR, no acute ischemic changes noted. No prior EKG available for comparison.   I have personally reviewed the EKG tracing and agree with the computer interpretation. Sinus  Rhythm  -Left axis.   ABNORMAL    Assessment & Plan:   1. Annual physical exam   2. Iron deficiency anemia due to chronic blood loss - seeing Dr. Alen Blew as 01/25/17 last labs w/ prior PCP Dr. Ernie Hew hgb 6.8, hct 22, mcv 76 down from hgb 8.9 on 10/03/16, hct 30.3, mcv 76 and ferritin 6 on 10/04/16 up from 08/02/16 when ferritin 4 w/ hgb 9.0, hct 30.3; on 05/23/16 hgb 9.4, hct 30.4 w/ ferritin 5. 03/14/16 hgb 8.4, hct 27.9 w/ ferritin 5; 01/11/16 hgb 8.8, hct 27.4.  Last cbc nml 01/05/15 hgb 11.5, hct 34.4 MCV 83; serum iron 45 (nml 35-155) 01/06/2014.  01/03/14 hgb 9.0, hct 30.1, MCV 72  3. Malignant neoplasm of upper-inner quadrant of left breast in female, estrogen receptor positive ([place]) - starting to see Dr. Lindi Adie  4. Acquired hypothyroidism - on levothyroxine 125 but would like to DECREASE dose if she could - wants to be on the last amount/quant of medicine that she can.  Prior PCP recs show labs last drawn 01/25/17 TSH 0.729 (nml 0.45-4.5) - I think good idea to try decreasing to 112 if she would like. Prior to that TSH  0.481 10/04/16, 08/02/16 TSH suppressed at 0.387; 05/24/16 TSH suppressed at 0.199; 03/15/16 TSH 6.310 mildly elevated (nml 0.45-4.5); 01/12/16 TSH mildly elevated 5.590; 03/17/15 TSH 0.788; 01/06/15 TSH 0.588, TSH 1.570 01/03/14 TPO ab nml 11 (nml 0-34) 01/06/15  5. Routine screening for STI (sexually transmitted infection)   6. Screening for cardiovascular, respiratory, and genitourinary diseases - Last lipid panel: LDL 64, tot 132, trig 89, HDL 50.  Lipids 01/25/17 LDL 57, HDL 40, Trig 76, t chol 112 Lipids 01/11/16 LDL 92, HDL 56, Trig 52, t chol 159 Lipids 03/17/15   LDL 62, HDL 50, Trig 64, t chol 125 Lipids 01/05/15   LDL 38, HDL 46, Trig 279, t chol 140 UA nml  7. Screening for cervical cancer - follows w/ Dr. Louretta Shorten at [facility]  8. Screening for deficiency anemia   9. Abnormally low peak expiratory flow rate - peak flow is about 1/2 of what is expected and (I THINK) no improvement on albuterol neb; EKG - recommend CXR but not done today as lots of recent chest imaging to eval breast cancer   Get prior labs/records from prior PCP Dr. Rachell Cipro and from gynecologist Dr. Louretta Shorten at [facility] - received records from Dr. Ernie Hew - labs above and put to be "quick abstracted" into chart  Vitamin D 37.3 01/06/15 Vitamin D, 25-OH 34.5 01/03/2014  Orders Placed This Encounter  Procedures  . DG Chest 2 View    Standing Status:   Future    Number of Occurrences:   1    Standing Expiration Date:   08/15/2018    Order Specific Question:   Reason for Exam (SYMPTOM  OR DIAGNOSIS REQUIRED)    Answer:   decreased voltage on EKG, peak flow 50% predicted    Order Specific Question:   Is  the patient pregnant?    Answer:   No    Order Specific Question:   Preferred imaging location?    Answer:   External  . Care order/instruction:    Scheduling Instructions:     Peak Flow (IF NEB IS ORDERED PLEASE DO BEFORE AND AFTER NEB)  . POCT urinalysis dipstick  . EKG 12-Lead    Meds  ordered this encounter  Medications  . albuterol (PROVENTIL) (2.5 MG/3ML) 0.083% nebulizer solution 2.5 mg    I personally performed the services described in this documentation, which was scribed in my presence. The recorded information has been reviewed and considered, and addended by me as needed.   Delman Cheadle, M.D.  Primary Care at [facility] [address] [place], [place] [address] [phone] phone [phone] fax  09/01/17 12:00 AM

## 2017-08-20 ENCOUNTER — Encounter (HOSPITAL_BASED_OUTPATIENT_CLINIC_OR_DEPARTMENT_OTHER): Payer: Self-pay | Admitting: *Deleted

## 2017-08-20 ENCOUNTER — Other Ambulatory Visit: Payer: Self-pay

## 2017-08-22 ENCOUNTER — Telehealth: Payer: Self-pay | Admitting: Family Medicine

## 2017-08-22 NOTE — Telephone Encounter (Signed)
Received medical records for pt from Dr. Ival Bible office on 08/21/17

## 2017-08-23 NOTE — Progress Notes (Signed)
Surgical soap given with instructions, pt verbalized understanding. Pt refused ensure pre surgery drink.

## 2017-08-26 NOTE — Anesthesia Preprocedure Evaluation (Addendum)
Anesthesia Evaluation  Patient identified by MRN, date of birth, ID band Patient awake    Reviewed: Allergy & Precautions, H&P , NPO status , Patient's Chart, lab work & pertinent test results, reviewed documented beta blocker date and time   Airway Mallampati: II  TM Distance: >3 FB Neck ROM: full    Dental no notable dental hx.    Pulmonary    Pulmonary exam normal breath sounds clear to auscultation       Cardiovascular Exercise Tolerance: Good  Rhythm:regular Rate:Normal     Neuro/Psych    GI/Hepatic   Endo/Other  Hypothyroidism   Renal/GU      Musculoskeletal   Abdominal   Peds  Hematology  (+) anemia ,   Anesthesia Other Findings   Reproductive/Obstetrics                             Anesthesia Physical Anesthesia Plan  ASA: II  Anesthesia Plan: General   Post-op Pain Management: GA combined w/ Regional for post-op pain   Induction: Intravenous  PONV Risk Score and Plan: 3 and Ondansetron, Dexamethasone and Treatment may vary due to age or medical condition  Airway Management Planned: Oral ETT and LMA  Additional Equipment:   Intra-op Plan:   Post-operative Plan: Extubation in OR  Informed Consent: I have reviewed the patients History and Physical, chart, labs and discussed the procedure including the risks, benefits and alternatives for the proposed anesthesia with the patient or authorized representative who has indicated his/her understanding and acceptance.   Dental Advisory Given  Plan Discussed with: CRNA, Anesthesiologist and Surgeon  Anesthesia Plan Comments: (  )       Anesthesia Quick Evaluation

## 2017-08-27 ENCOUNTER — Ambulatory Visit (HOSPITAL_BASED_OUTPATIENT_CLINIC_OR_DEPARTMENT_OTHER)
Admission: RE | Admit: 2017-08-27 | Discharge: 2017-08-27 | Disposition: A | Discharge status: Home / Self Care | Payer: POS | Source: Ambulatory Visit | Attending: General Surgery | Admitting: General Surgery

## 2017-08-27 ENCOUNTER — Ambulatory Visit (HOSPITAL_BASED_OUTPATIENT_CLINIC_OR_DEPARTMENT_OTHER): Payer: POS | Admitting: Anesthesiology

## 2017-08-27 ENCOUNTER — Encounter (HOSPITAL_BASED_OUTPATIENT_CLINIC_OR_DEPARTMENT_OTHER)
Admission: RE | Disposition: A | Discharge status: Home / Self Care | Payer: Self-pay | Source: Ambulatory Visit | Attending: General Surgery

## 2017-08-27 ENCOUNTER — Other Ambulatory Visit: Payer: Self-pay

## 2017-08-27 ENCOUNTER — Ambulatory Visit (HOSPITAL_COMMUNITY)
Admission: RE | Admit: 2017-08-27 | Discharge: 2017-08-27 | Disposition: A | Discharge status: Home / Self Care | Payer: POS | Source: Ambulatory Visit | Attending: General Surgery | Admitting: General Surgery

## 2017-08-27 ENCOUNTER — Encounter (HOSPITAL_BASED_OUTPATIENT_CLINIC_OR_DEPARTMENT_OTHER): Payer: Self-pay | Admitting: Anesthesiology

## 2017-08-27 DIAGNOSIS — Z170 Estrogen receptor positive status [ER+]: Secondary | ICD-10-CM | POA: Insufficient documentation

## 2017-08-27 DIAGNOSIS — D649 Anemia, unspecified: Secondary | ICD-10-CM | POA: Insufficient documentation

## 2017-08-27 DIAGNOSIS — Z803 Family history of malignant neoplasm of breast: Secondary | ICD-10-CM | POA: Diagnosis not present

## 2017-08-27 DIAGNOSIS — E039 Hypothyroidism, unspecified: Secondary | ICD-10-CM | POA: Diagnosis not present

## 2017-08-27 DIAGNOSIS — C773 Secondary and unspecified malignant neoplasm of axilla and upper limb lymph nodes: Secondary | ICD-10-CM | POA: Diagnosis not present

## 2017-08-27 DIAGNOSIS — C50212 Malignant neoplasm of upper-inner quadrant of left female breast: Secondary | ICD-10-CM | POA: Insufficient documentation

## 2017-08-27 DIAGNOSIS — Z87891 Personal history of nicotine dependence: Secondary | ICD-10-CM | POA: Insufficient documentation

## 2017-08-27 DIAGNOSIS — C50912 Malignant neoplasm of unspecified site of left female breast: Secondary | ICD-10-CM | POA: Diagnosis present

## 2017-08-27 HISTORY — DX: Anemia, unspecified: D64.9

## 2017-08-27 HISTORY — DX: Malignant (primary) neoplasm, unspecified: C80.1

## 2017-08-27 HISTORY — DX: Hypothyroidism, unspecified: E03.9

## 2017-08-27 HISTORY — PX: BREAST LUMPECTOMY WITH AXILLARY LYMPH NODE BIOPSY: SHX5593

## 2017-08-27 SURGERY — BREAST LUMPECTOMY WITH AXILLARY LYMPH NODE BIOPSY
Anesthesia: General | Site: Breast | Laterality: Left

## 2017-08-27 MED ORDER — ONDANSETRON HCL 4 MG/2ML IJ SOLN
4.0000 mg | Freq: Once | INTRAMUSCULAR | Status: AC | PRN
  Administered 2017-08-27: 4 mg via INTRAVENOUS

## 2017-08-27 MED ORDER — LIDOCAINE HCL (CARDIAC) PF 100 MG/5ML IV SOSY
PREFILLED_SYRINGE | INTRAVENOUS | Status: AC
  Filled 2017-08-27: qty 5

## 2017-08-27 MED ORDER — VANCOMYCIN HCL IN DEXTROSE 1-5 GM/200ML-% IV SOLN
1000.0000 mg | INTRAVENOUS | Status: AC
  Administered 2017-08-27 (×2): 1000 mg via INTRAVENOUS

## 2017-08-27 MED ORDER — MIDAZOLAM HCL 2 MG/2ML IJ SOLN
INTRAMUSCULAR | Status: AC
  Filled 2017-08-27: qty 2

## 2017-08-27 MED ORDER — ACETAMINOPHEN 160 MG/5ML PO SOLN: 325.0000 mg | ORAL | Status: DC | PRN

## 2017-08-27 MED ORDER — BUPIVACAINE HCL (PF) 0.25 % IJ SOLN
INTRAMUSCULAR | Status: AC
  Filled 2017-08-27: qty 30

## 2017-08-27 MED ORDER — CELECOXIB 200 MG PO CAPS
ORAL_CAPSULE | ORAL | Status: AC
  Filled 2017-08-27: qty 1

## 2017-08-27 MED ORDER — PROPOFOL 10 MG/ML IV BOLUS
INTRAVENOUS | Status: DC | PRN
  Administered 2017-08-27: 150 mg via INTRAVENOUS
  Administered 2017-08-27: 40 mg via INTRAVENOUS

## 2017-08-27 MED ORDER — FENTANYL CITRATE (PF) 100 MCG/2ML IJ SOLN
INTRAMUSCULAR | Status: AC
  Filled 2017-08-27: qty 2

## 2017-08-27 MED ORDER — BUPIVACAINE LIPOSOME 1.3 % IJ SUSP
INTRAMUSCULAR | Status: DC | PRN
  Administered 2017-08-27: 10 mL via PERINEURAL

## 2017-08-27 MED ORDER — HYDROCODONE-ACETAMINOPHEN 5-325 MG PO TABS: 1.0000 | ORAL_TABLET | Freq: Four times a day (QID) | ORAL | 0 refills | Status: DC | PRN

## 2017-08-27 MED ORDER — PHENYLEPHRINE 40 MCG/ML (10ML) SYRINGE FOR IV PUSH (FOR BLOOD PRESSURE SUPPORT)
PREFILLED_SYRINGE | INTRAVENOUS | Status: AC
  Filled 2017-08-27: qty 10

## 2017-08-27 MED ORDER — METHYLENE BLUE 0.5 % INJ SOLN
INTRAVENOUS | Status: AC
  Filled 2017-08-27: qty 10

## 2017-08-27 MED ORDER — CELECOXIB 200 MG PO CAPS
200.0000 mg | ORAL_CAPSULE | ORAL | Status: AC
Start: 2017-08-27 — End: 2017-08-27
  Administered 2017-08-27: 200 mg via ORAL

## 2017-08-27 MED ORDER — TECHNETIUM TC 99M SULFUR COLLOID FILTERED
1.0000 | Freq: Once | INTRAVENOUS | Status: AC | PRN
  Administered 2017-08-27: 1 via INTRADERMAL

## 2017-08-27 MED ORDER — LIDOCAINE HCL (CARDIAC) PF 100 MG/5ML IV SOSY
PREFILLED_SYRINGE | INTRAVENOUS | Status: DC | PRN
  Administered 2017-08-27: 30 mg via INTRAVENOUS

## 2017-08-27 MED ORDER — MIDAZOLAM HCL 2 MG/2ML IJ SOLN
1.0000 mg | INTRAMUSCULAR | Status: DC | PRN
  Administered 2017-08-27: 2 mg via INTRAVENOUS

## 2017-08-27 MED ORDER — FENTANYL CITRATE (PF) 100 MCG/2ML IJ SOLN
INTRAMUSCULAR | Status: DC | PRN
  Administered 2017-08-27: 100 ug via INTRAVENOUS

## 2017-08-27 MED ORDER — EPHEDRINE SULFATE 50 MG/ML IJ SOLN
INTRAMUSCULAR | Status: DC | PRN
  Administered 2017-08-27: 25 mg via INTRAVENOUS

## 2017-08-27 MED ORDER — CHLORHEXIDINE GLUCONATE CLOTH 2 % EX PADS: 6.0000 | MEDICATED_PAD | Freq: Once | CUTANEOUS | Status: DC

## 2017-08-27 MED ORDER — PROPOFOL 10 MG/ML IV BOLUS
INTRAVENOUS | Status: AC
  Filled 2017-08-27: qty 20

## 2017-08-27 MED ORDER — ACETAMINOPHEN 500 MG PO TABS
ORAL_TABLET | ORAL | Status: AC
  Filled 2017-08-27: qty 2

## 2017-08-27 MED ORDER — OXYCODONE HCL 5 MG/5ML PO SOLN: 5.0000 mg | Freq: Once | ORAL | Status: DC | PRN

## 2017-08-27 MED ORDER — ONDANSETRON HCL 4 MG/2ML IJ SOLN
INTRAMUSCULAR | Status: DC | PRN
  Administered 2017-08-27: 4 mg via INTRAVENOUS

## 2017-08-27 MED ORDER — MEPERIDINE HCL 25 MG/ML IJ SOLN: 6.2500 mg | INTRAMUSCULAR | Status: DC | PRN

## 2017-08-27 MED ORDER — DEXAMETHASONE SODIUM PHOSPHATE 4 MG/ML IJ SOLN
INTRAMUSCULAR | Status: DC | PRN
  Administered 2017-08-27: 10 mg via INTRAVENOUS

## 2017-08-27 MED ORDER — GABAPENTIN 300 MG PO CAPS
ORAL_CAPSULE | ORAL | Status: AC
  Filled 2017-08-27: qty 1

## 2017-08-27 MED ORDER — BUPIVACAINE HCL (PF) 0.5 % IJ SOLN
INTRAMUSCULAR | Status: DC | PRN
  Administered 2017-08-27: 20 mL via PERINEURAL

## 2017-08-27 MED ORDER — ONDANSETRON HCL 4 MG/2ML IJ SOLN
INTRAMUSCULAR | Status: AC
  Filled 2017-08-27: qty 2

## 2017-08-27 MED ORDER — BUPIVACAINE-EPINEPHRINE (PF) 0.25% -1:200000 IJ SOLN
INTRAMUSCULAR | Status: DC | PRN
  Administered 2017-08-27: 12 mL

## 2017-08-27 MED ORDER — OXYCODONE HCL 5 MG PO TABS: 5.0000 mg | ORAL_TABLET | Freq: Once | ORAL | Status: DC | PRN

## 2017-08-27 MED ORDER — EPHEDRINE SULFATE 50 MG/ML IJ SOLN
INTRAMUSCULAR | Status: AC
  Filled 2017-08-27: qty 1

## 2017-08-27 MED ORDER — LACTATED RINGERS IV SOLN
INTRAVENOUS | Status: DC
  Administered 2017-08-27: 08:00:00 via INTRAVENOUS

## 2017-08-27 MED ORDER — SODIUM CHLORIDE 0.9 % IJ SOLN
INTRAMUSCULAR | Status: AC
  Filled 2017-08-27: qty 10

## 2017-08-27 MED ORDER — FENTANYL CITRATE (PF) 100 MCG/2ML IJ SOLN
50.0000 ug | INTRAMUSCULAR | Status: DC | PRN
  Administered 2017-08-27: 100 ug via INTRAVENOUS

## 2017-08-27 MED ORDER — MIDAZOLAM HCL 5 MG/5ML IJ SOLN
INTRAMUSCULAR | Status: DC | PRN
  Administered 2017-08-27: 2 mg via INTRAVENOUS

## 2017-08-27 MED ORDER — VANCOMYCIN HCL IN DEXTROSE 1-5 GM/200ML-% IV SOLN
INTRAVENOUS | Status: AC
  Filled 2017-08-27: qty 200

## 2017-08-27 MED ORDER — 0.9 % SODIUM CHLORIDE (POUR BTL) OPTIME
TOPICAL | Status: DC | PRN
  Administered 2017-08-27: 200 mL

## 2017-08-27 MED ORDER — ACETAMINOPHEN 500 MG PO TABS
1000.0000 mg | ORAL_TABLET | ORAL | Status: AC
  Administered 2017-08-27: 1000 mg via ORAL

## 2017-08-27 MED ORDER — FENTANYL CITRATE (PF) 100 MCG/2ML IJ SOLN: 25.0000 ug | INTRAMUSCULAR | Status: DC | PRN

## 2017-08-27 MED ORDER — GABAPENTIN 300 MG PO CAPS
300.0000 mg | ORAL_CAPSULE | ORAL | Status: AC
Start: 2017-08-27 — End: 2017-08-27
  Administered 2017-08-27: 300 mg via ORAL

## 2017-08-27 MED ORDER — SCOPOLAMINE 1 MG/3DAYS TD PT72: 1.0000 | MEDICATED_PATCH | Freq: Once | TRANSDERMAL | Status: DC | PRN

## 2017-08-27 MED ORDER — DEXAMETHASONE SODIUM PHOSPHATE 10 MG/ML IJ SOLN
INTRAMUSCULAR | Status: AC
  Filled 2017-08-27: qty 1

## 2017-08-27 MED ORDER — ACETAMINOPHEN 325 MG PO TABS: 325.0000 mg | ORAL_TABLET | ORAL | Status: DC | PRN

## 2017-08-27 SURGICAL SUPPLY — 46 items
APPLIER CLIP 11 MED OPEN (CLIP) ×2
BLADE SURG 15 STRL LF DISP TIS (BLADE) ×1 IMPLANT
BLADE SURG 15 STRL SS (BLADE) ×1
CANISTER SUCT 1200ML W/VALVE (MISCELLANEOUS) IMPLANT
CHLORAPREP W/TINT 26ML (MISCELLANEOUS) ×2 IMPLANT
CLIP APPLIE 11 MED OPEN (CLIP) ×1 IMPLANT
COVER BACK TABLE 60X90IN (DRAPES) ×2 IMPLANT
COVER MAYO STAND STRL (DRAPES) ×2 IMPLANT
COVER PROBE W GEL 5X96 (DRAPES) ×2 IMPLANT
DECANTER SPIKE VIAL GLASS SM (MISCELLANEOUS) IMPLANT
DERMABOND ADVANCED (GAUZE/BANDAGES/DRESSINGS) ×1
DERMABOND ADVANCED .7 DNX12 (GAUZE/BANDAGES/DRESSINGS) ×1 IMPLANT
DEVICE DUBIN W/COMP PLATE 8390 (MISCELLANEOUS) IMPLANT
DRAPE LAPAROSCOPIC ABDOMINAL (DRAPES) ×2 IMPLANT
DRAPE UTILITY XL STRL (DRAPES) ×2 IMPLANT
ELECT COATED BLADE 2.86 ST (ELECTRODE) ×2 IMPLANT
ELECT REM PT RETURN 9FT ADLT (ELECTROSURGICAL) ×2
ELECTRODE REM PT RTRN 9FT ADLT (ELECTROSURGICAL) ×1 IMPLANT
GLOVE BIO SURGEON STRL SZ 6.5 (GLOVE) ×2 IMPLANT
GLOVE BIO SURGEON STRL SZ7.5 (GLOVE) ×4 IMPLANT
GLOVE BIOGEL PI IND STRL 7.0 (GLOVE) ×1 IMPLANT
GLOVE BIOGEL PI INDICATOR 7.0 (GLOVE) ×1
GLOVE ECLIPSE 6.5 STRL STRAW (GLOVE) ×2 IMPLANT
GLOVE EXAM NITRILE MD LF STRL (GLOVE) ×2 IMPLANT
GOWN STRL REUS W/ TWL LRG LVL3 (GOWN DISPOSABLE) ×3 IMPLANT
GOWN STRL REUS W/TWL LRG LVL3 (GOWN DISPOSABLE) ×3
ILLUMINATOR WAVEGUIDE N/F (MISCELLANEOUS) IMPLANT
KIT MARKER MARGIN INK (KITS) ×2 IMPLANT
LIGHT WAVEGUIDE WIDE FLAT (MISCELLANEOUS) IMPLANT
NDL SAFETY ECLIPSE 18X1.5 (NEEDLE) IMPLANT
NEEDLE HYPO 18GX1.5 SHARP (NEEDLE)
NEEDLE HYPO 25X1 1.5 SAFETY (NEEDLE) ×2 IMPLANT
NS IRRIG 1000ML POUR BTL (IV SOLUTION) ×2 IMPLANT
PACK BASIN DAY SURGERY FS (CUSTOM PROCEDURE TRAY) ×2 IMPLANT
PENCIL BUTTON HOLSTER BLD 10FT (ELECTRODE) ×2 IMPLANT
SLEEVE SCD COMPRESS KNEE MED (MISCELLANEOUS) ×2 IMPLANT
SPONGE LAP 18X18 RF (DISPOSABLE) ×2 IMPLANT
SUT ETHILON 3 0 FSL (SUTURE) IMPLANT
SUT MON AB 4-0 PC3 18 (SUTURE) ×4 IMPLANT
SUT SILK 3 0 PS 1 (SUTURE) IMPLANT
SUT VICRYL 3-0 CR8 SH (SUTURE) ×2 IMPLANT
SYR CONTROL 10ML LL (SYRINGE) ×2 IMPLANT
TOWEL GREEN STERILE FF (TOWEL DISPOSABLE) ×2 IMPLANT
TOWEL OR NON WOVEN STRL DISP B (DISPOSABLE) IMPLANT
TUBE CONNECTING 20X1/4 (TUBING) IMPLANT
YANKAUER SUCT BULB TIP NO VENT (SUCTIONS) IMPLANT

## 2017-08-27 NOTE — Transfer of Care (Signed)
Immediate Anesthesia Transfer of Care Note  Patient: Gloria Kramer  Procedure(s) Performed: LEFT BREAST LUMPECTOMY WITH AXILLARY LYMPH NODE BIOPSY (Left Breast)  Patient Location: PACU  Anesthesia Type:GA combined with regional for post-op pain  Level of Consciousness: sedated  Airway & Oxygen Therapy: Patient Spontanous Breathing and Patient connected to face mask oxygen  Post-op Assessment: Report given to RN and Post -op Vital signs reviewed and stable  Post vital signs: Reviewed and stable  Last Vitals:  Vitals Value Taken Time  BP    Temp    Pulse 80 08/27/2017 10:34 AM  Resp    SpO2 100 % 08/27/2017 10:34 AM  Vitals shown include unvalidated device data.  Last Pain: There were no vitals filed for this visit.    Patients Stated Pain Goal: 2 (84/06/98 6148)  Complications: No apparent anesthesia complications

## 2017-08-27 NOTE — Anesthesia Postprocedure Evaluation (Signed)
Anesthesia Post Note  Patient: Gloria Kramer  Procedure(s) Performed: LEFT BREAST LUMPECTOMY WITH AXILLARY LYMPH NODE BIOPSY (Left Breast)     Patient location during evaluation: PACU Anesthesia Type: General Level of consciousness: awake and alert Pain management: pain level controlled Vital Signs Assessment: post-procedure vital signs reviewed and stable Respiratory status: spontaneous breathing, nonlabored ventilation, respiratory function stable and patient connected to nasal cannula oxygen Cardiovascular status: blood pressure returned to baseline and stable Postop Assessment: no apparent nausea or vomiting Anesthetic complications: no    Last Vitals:  Vitals:   08/27/17 1247 08/27/17 1341  BP: (!) 139/91 126/81  Pulse: 69 70  Resp: 16 16  Temp: (!) 36.4 C   SpO2: 98% 100%    Last Pain:  Vitals:   08/27/17 1315  PainSc: 0-No pain                 Tiajuana Amass

## 2017-08-27 NOTE — Anesthesia Procedure Notes (Addendum)
Anesthesia Regional Block: Pectoralis block   Pre-Anesthetic Checklist: ,, timeout performed, Correct Patient, Correct Site, Correct Laterality, Correct Procedure, Correct Position, site marked, Risks and benefits discussed,  Surgical consent,  Pre-op evaluation,  At surgeon's request and post-op pain management  Laterality: Left  Prep: chloraprep       Needles:  Injection technique: Single-shot  Needle Type: Echogenic Stimulator Needle     Needle Length: 5cm  Needle Gauge: 22     Additional Needles:   Procedures:, nerve stimulator,,, ultrasound used (permanent image in chart),,,,  Narrative:  Start time: 08/27/2017 7:45 AM End time: 08/27/2017 7:50 AM Injection made incrementally with aspirations every 5 mL.  Performed by: Personally  Anesthesiologist: Janeece Riggers, MD  Additional Notes: Functioning IV was confirmed and monitors were applied.  A 89mm 22ga Arrow echogenic stimulator needle was used. Sterile prep and drape,hand hygiene and sterile gloves were used. Ultrasound guidance: relevant anatomy identified, needle position confirmed, local anesthetic spread visualized around nerve(s)., vascular puncture avoided.  Image printed for medical record. Negative aspiration and negative test dose prior to incremental administration of local anesthetic. The patient tolerated the procedure well.

## 2017-08-27 NOTE — Progress Notes (Signed)
Assisted Dr. Oddono with left, ultrasound guided, pectoralis block. Side rails up, monitors on throughout procedure. See vital signs in flow sheet. Tolerated Procedure well. °

## 2017-08-27 NOTE — Interval H&P Note (Signed)
History and Physical Interval Note:  08/27/2017 8:26 AM  Gloria Kramer  has presented today for surgery, with the diagnosis of LEFT BREAST CANCER  The various methods of treatment have been discussed with the patient and family. After consideration of risks, benefits and other options for treatment, the patient has consented to  Procedure(s): LEFT BREAST LUMPECTOMY WITH AXILLARY LYMPH NODE BIOPSY (Left) as a surgical intervention .  The patient's history has been reviewed, patient examined, no change in status, stable for surgery.  I have reviewed the patient's chart and labs.  Questions were answered to the patient's satisfaction.     TOTH III,PAUL S

## 2017-08-27 NOTE — Op Note (Signed)
08/27/2017  10:28 AM  PATIENT:  Gloria Kramer  52 y.o. female  PRE-OPERATIVE DIAGNOSIS:  LEFT BREAST CANCER  POST-OPERATIVE DIAGNOSIS:  LEFT BREAST CANCER  PROCEDURE:  Procedure(s): LEFT BREAST LUMPECTOMY WITH DEEP LEFT AXILLARY SENTINEL LYMPH NODE BIOPSY   SURGEON:  Surgeon(s) and Role:    * Jovita Kussmaul, MD - Primary  PHYSICIAN ASSISTANT:   ASSISTANTS: none   ANESTHESIA:   local and general  EBL:  minimal   BLOOD ADMINISTERED:none  DRAINS: none   LOCAL MEDICATIONS USED:  MARCAINE     SPECIMEN:  Source of Specimen:  Left breast tissue and sentinel nodes X 4  DISPOSITION OF SPECIMEN:  PATHOLOGY  COUNTS:  YES  TOURNIQUET:  * No tourniquets in log *  DICTATION: .Dragon Dictation   After informed consent was obtained the patient was brought to the operating room and placed in the supine position on the operating table.  After adequate induction of general anesthesia the patient's left breast, chest, and axillary area were prepped with ChloraPrep, allowed to dry, and draped in usual sterile manner.  An appropriate timeout was performed.  Earlier in the day the patient underwent injection 1 mCi of technetium sulfur colloid in the subareolar position on the left.  The neoprobe was set to technetium in an area of radioactivity was readily identified in the left axilla.  This area was infiltrated with quarter percent Marcaine.  A small incision was made with a 15 blade knife overlying the area of radioactivity.  The incision was carried through the skin and subcutaneous tissue sharply with electrocautery until the deep left axillary space was entered.  The neoprobe was used to direct blunt hemostat dissection in the area.  I was able to identify 4 lymph nodes with increased radioactivity.  These nodes were excised sharply with the electrocautery and the lymphatics were controlled with clips.  Ex vivo counts on these nodes ranged from 200 to 2500.  No other hot or palpable lymph nodes  were identified in the left axilla.  The area was examined and found to be hemostatic.  The deep layer of the wound was then closed with interrupted 3-0 Vicryl stitches.  The skin was then closed with a running 4-0 Monocryl subcuticular stitch.  Attention was then turned to the left breast.  The cancer in the inner aspect of the left breast was large and palpable.  In order to have the best chance of getting clean margins I made an elliptical incision in the skin overlying the mass.  The incision was carried through the skin and subcutaneous tissue sharply with electrocautery.  The dissection was carried around the palpable mass and all the way to the chest wall.  Once the mass was removed it was oriented with the appropriate paint colors.  A specimen radiograph was obtained that showed the clip in the center of the specimen.  The specimen was then sent to pathology for further evaluation.  Hemostasis was achieved using the Bovie electrocautery.  The wound was irrigated with saline.  The wound was then infiltrated with quarter percent Marcaine.  The deep layer of the wound was then closed with layers of interrupted 3-0 Vicryl stitches.  The skin was then closed with interrupted 4-0 Monocryl subcuticular stitches.  Dermabond dressings were applied.  The patient tolerated the procedure well.  At the end of the case all needle sponge and instrument counts were correct.  The patient was then awakened and taken recovery in stable condition.  PLAN OF CARE: Discharge to home after PACU  PATIENT DISPOSITION:  PACU - hemodynamically stable.   Delay start of Pharmacological VTE agent (>24hrs) due to surgical blood loss or risk of bleeding: not applicable

## 2017-08-27 NOTE — Anesthesia Procedure Notes (Signed)
Procedure Name: LMA Insertion Date/Time: 08/27/2017 9:03 AM Performed by: Marrianne Mood, CRNA Pre-anesthesia Checklist: Patient identified, Emergency Drugs available, Suction available, Patient being monitored and Timeout performed Patient Re-evaluated:Patient Re-evaluated prior to induction Oxygen Delivery Method: Circle system utilized Preoxygenation: Pre-oxygenation with 100% oxygen Induction Type: IV induction Ventilation: Mask ventilation without difficulty LMA: LMA inserted LMA Size: 4.0 Number of attempts: 1 Airway Equipment and Method: Bite block Placement Confirmation: positive ETCO2 Tube secured with: Tape Dental Injury: Teeth and Oropharynx as per pre-operative assessment

## 2017-08-27 NOTE — Discharge Instructions (Signed)
Tylenol 1,000mg  given today at 7:30AM  [place] Instructions  Activity: Get plenty of rest for the remainder of the day. A responsible individual must stay with you for 24 hours following the procedure.  For the next 24 hours, DO NOT: -Drive a car -Paediatric nurse -Drink alcoholic beverages -Take any medication unless instructed by your physician -Make any legal decisions or sign important papers.  Meals: Start with liquid foods such as gelatin or soup. Progress to regular foods as tolerated. Avoid greasy, spicy, heavy foods. If nausea and/or vomiting occur, drink only clear liquids until the nausea and/or vomiting subsides. Call your physician if vomiting continues.  Special Instructions/Symptoms: Your throat may feel dry or sore from the anesthesia or the breathing tube placed in your throat during surgery. If this causes discomfort, gargle with warm salt water. The discomfort should disappear within 24 hours.  If you had a scopolamine patch placed behind your ear for the management of post- operative nausea and/or vomiting:  1. The medication in the patch is effective for 72 hours, after which it should be removed.  Wrap patch in a tissue and discard in the trash. Wash hands thoroughly with soap and water. 2. You may remove the patch earlier than 72 hours if you experience unpleasant side effects which may include dry mouth, dizziness or visual disturbances. 3. Avoid touching the patch. Wash your hands with soap and water after contact with the patch.

## 2017-08-27 NOTE — H&P (Signed)
Gloria Kramer  Location: [facility] Surgery Patient #: [id number] DOB: 02-24-1966 Married / Language: English / Race: White Female   History of Present Illness  The patient is a 52 year old female who presents with breast cancer. We are asked to see the patient in consultation by Dr. Miquel Dunn to evaluate her for a new left breast cancer. The patient is a 52 year old white female who felt a mass in her upper inner quadrant of the left breast about one month ago. She brought this to her medical doctors attention who got a mammogram. The mass measured about 2.5 cm. 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 Her tumor markers are all pending. She does not smoke. She does have a history of breast cancer in her mother and a maternal cousin.   Past Surgical History  Breast Biopsy  Left. Oral Surgery   Diagnostic Studies History Colonoscopy  never Mammogram  within last year Pap Smear  1-5 years ago  Allergies No Known Drug Allergies  Allergies Reconciled   Medication History  Synthroid (125MCG Tablet, Oral) Active. Medications Reconciled  Social History  Alcohol use  Occasional alcohol use. Caffeine use  Carbonated beverages, Coffee, Tea. No drug use  Tobacco use  Former smoker.  Family History  Alcohol Abuse  Father. Arthritis  Father. Bleeding disorder  Father. Breast Cancer  Family Members In General, Mother. Cancer  Father. Colon Polyps  Mother. Hypertension  Father, Mother. Thyroid problems  Family Members In [place], Sister, Son.  Pregnancy / Birth History  Age at menarche  41 years. Gravida  8 Irregular periods  Length (months) of breastfeeding  7-12 Maternal age  49-25 Para  63  Other Problems  Breast Cancer  Lump In Breast  Thyroid Disease     Review of Systems  General Not Present- Appetite Loss, Chills, Fatigue, Fever, Night Sweats, Weight Gain and Weight Loss. Skin Not Present-  Change in Wart/Mole, Dryness, Hives, Jaundice, New Lesions, Non-Healing Wounds, Rash and Ulcer. HEENT Present- Wears glasses/contact lenses. Not Present- Earache, Hearing Loss, Hoarseness, Nose Bleed, Oral Ulcers, Ringing in the Ears, Seasonal Allergies, Sinus Pain, Sore Throat, Visual Disturbances and Yellow Eyes. Respiratory Not Present- Bloody sputum, Chronic Cough, Difficulty Breathing, Snoring and Wheezing. Breast Present- Breast Mass. Not Present- Breast Pain, Nipple Discharge and Skin Changes. Cardiovascular Not Present- Chest Pain, Difficulty Breathing Lying Down, Leg Cramps, Palpitations, Rapid Heart Rate, Shortness of Breath and Swelling of Extremities. Gastrointestinal Not Present- Abdominal Pain, Bloating, Bloody Stool, Change in Bowel Habits, Chronic diarrhea, Constipation, Difficulty Swallowing, Excessive gas, Gets full quickly at meals, Hemorrhoids, Indigestion, Nausea, Rectal Pain and Vomiting. Female Genitourinary Not Present- Frequency, Nocturia, Painful Urination, Pelvic Pain and Urgency. Musculoskeletal Present- Joint Stiffness. Not Present- Back Pain, Joint Pain, Muscle Pain, Muscle Weakness and Swelling of Extremities. Neurological Not Present- Decreased Memory, Fainting, Headaches, Numbness, Seizures, Tingling, Tremor, Trouble walking and Weakness. Psychiatric Not Present- Anxiety, Bipolar, Change in Sleep Pattern, Depression, Fearful and Frequent crying. Endocrine Present- Cold Intolerance and Hair Changes. Not Present- Excessive Hunger, Heat Intolerance, Hot flashes and New Diabetes. Hematology Not Present- Blood Thinners, Easy Bruising, Excessive bleeding, Gland problems, HIV and Persistent Infections.  Vitals Weight: 125.13 lb Height: 68in Body Surface Area: 1.67 m Body Mass Index: 19.02 kg/m  Temp.: 98.87F(Oral)  Pulse: 94 (Regular)  BP: 132/80 (Sitting, Left Arm, Standard)       Physical Exam  General Mental Status-Alert. General  Appearance-Consistent with stated age. Hydration-Well hydrated. Voice-Normal.  Head and Neck Head-normocephalic, atraumatic with no lesions or palpable masses. Trachea-midline. Thyroid Gland Characteristics - normal size and consistency.  Eye Eyeball - Bilateral-Extraocular movements intact. Sclera/Conjunctiva - Bilateral-No scleral icterus.  Chest and Lung Exam Chest and lung exam reveals -quiet, even and easy respiratory effort with no use of accessory muscles and on auscultation, normal breath sounds, no adventitious sounds and normal vocal resonance. Inspection Chest Wall - Normal. Back - normal.  Breast Note: There is a 3 cm palpable mass in the upper-inner quadrant of the left breast. There is no overlying skin changes. There is no palpable mass in the right breast. There is no palpable axillary, supraclavicular, or cervical lymphadenopathy.   Cardiovascular Cardiovascular examination reveals -normal heart sounds, regular rate and rhythm with no murmurs and normal pedal pulses bilaterally.  Abdomen Inspection Inspection of the abdomen reveals - No Hernias. Skin - Scar - no surgical scars. Palpation/Percussion Palpation and Percussion of the abdomen reveal - Soft, Non Tender, No Rebound tenderness, No Rigidity (guarding) and No hepatosplenomegaly. Auscultation Auscultation of the abdomen reveals - Bowel sounds normal.  Neurologic Neurologic evaluation reveals -alert and oriented x 3 with no impairment of recent or remote memory. Mental Status-Normal.  Musculoskeletal Normal Exam - Left-Upper Extremity Strength Normal and Lower Extremity Strength Normal. Normal Exam - Right-Upper Extremity Strength Normal and Lower Extremity Strength Normal.  Lymphatic Head & Neck  General Head & Neck Lymphatics: Bilateral - Description - Normal. Axillary  General Axillary Region: Bilateral - Description - Normal. Tenderness - Non Tender. Femoral &  Inguinal  Generalized Femoral & Inguinal Lymphatics: Bilateral - Description - Normal. Tenderness - Non Tender.     MALIGNANT NEOPLASM OF UPPER-INNER QUADRANT OF LEFT FEMALE BREAST, UNSPECIFIED ESTROGEN RECEPTOR STATUS (C50.212) Impression: The patient appears to have a 2.5 cm cancer in the upper inner left breast. Unfortunately none of her tumor markers have been reported yet. At this point we have talked in detail about the different options for treatment. We will wait to get the rest of the information before making any final decisions. I will go ahead and refer her to medical and radiation oncology as well as to genetics given her family history. She will likely need an MRI study. I will also go ahead and refer her to plastic surgery to get information about reconstruction so she can make an informed decision. I will call her back once we have the final tumor marker report so we can discuss how to proceed. Current Plans Referred to Genetic Counseling, for evaluation and follow up (Medical Genetics). Routine. Referred to Oncology, for evaluation and follow up (Oncology). Routine. Referred to Surgery - Plastic, for evaluation and follow up (Plastic Surgery). Routine.

## 2017-08-28 ENCOUNTER — Encounter (HOSPITAL_BASED_OUTPATIENT_CLINIC_OR_DEPARTMENT_OTHER): Payer: Self-pay | Admitting: General Surgery

## 2017-09-01 MED ORDER — SYNTHROID 112 MCG PO TABS
112.0000 ug | ORAL_TABLET | Freq: Every day | ORAL | 1 refills | Status: DC
Start: 2017-09-01 — End: 2017-09-30

## 2017-09-03 ENCOUNTER — Telehealth: Payer: Self-pay | Admitting: *Deleted

## 2017-09-03 ENCOUNTER — Inpatient Hospital Stay: Payer: POS | Attending: Oncology | Admitting: Hematology and Oncology

## 2017-09-03 DIAGNOSIS — Z170 Estrogen receptor positive status [ER+]: Secondary | ICD-10-CM

## 2017-09-03 DIAGNOSIS — G893 Neoplasm related pain (acute) (chronic): Secondary | ICD-10-CM | POA: Insufficient documentation

## 2017-09-03 DIAGNOSIS — C773 Secondary and unspecified malignant neoplasm of axilla and upper limb lymph nodes: Secondary | ICD-10-CM | POA: Diagnosis not present

## 2017-09-03 DIAGNOSIS — C50212 Malignant neoplasm of upper-inner quadrant of left female breast: Secondary | ICD-10-CM | POA: Insufficient documentation

## 2017-09-03 NOTE — Assessment & Plan Note (Signed)
08/27/2017:Left lumpectomy: IDC grade 2, 2.2 cm, low-grade DCIS, lymphovascular invasion identified, margins negative, 1 lymph node with micrometastatic disease, 3 additional lymph nodes negative, ER 95%, PR 90%, Ki-67 15%, HER-2 negative ratio 1.26, T2N1 mic stage Ib  Pathology counseling: I discussed the final pathology report of the patient provided  a copy of this report. I discussed the margins as well as lymph node surgeries. We also discussed the final staging along with previously performed ER/PR and HER-2/neu testing.  Treatment plan: 1. Oncotype DX testing to determine if chemotherapy would be of any benefit followed by 2. Adjuvant radiation therapy followed by 3. Adjuvant antiestrogen therapy  Return to clinic based upon Oncotype DX testing

## 2017-09-03 NOTE — Progress Notes (Signed)
Patient Care Team: Gloria Knapp, MD as PCP - General (Family Medicine) Gloria Lose, MD as Consulting Physician (Hematology and Oncology) Gloria Shorten, MD as Consulting Physician (Obstetrics and Gynecology)  DIAGNOSIS:  Encounter Diagnosis  Name Primary?  . Malignant neoplasm of upper-inner quadrant of left breast in female, estrogen receptor positive ([name])     SUMMARY OF ONCOLOGIC HISTORY:   Malignant neoplasm of upper-inner quadrant of left breast in female, estrogen receptor positive ([name])   07/09/2017 Initial Diagnosis    Palpable mass left breast 3 cm from the nipple measuring 1.6 x 1.8 x 2.5 cm, biopsy revealed grade 2 IDC ER 95%, PR 90%, Ki-67 15%, HER-2 negative ratio 1.26, T2 N0 stage Ib clinical stage AJCC 8      07/27/2017 Genetic Testing    CHEK2 p.M381V VUS identified on the CancerNext panel.  The CancerNext gene panel offered by [employer] includes sequencing and rearrangement analysis for the following 34 genes:   APC, ATM, BARD1, BMPR1A, BRCA1, BRCA2, BRIP1, CDH1, CDK4, CDKN2A, CHEK2, DICER1, HOXB13, EPCAM, GREM1, MLH1, MRE11A, MSH2, MSH6, MUTYH, NBN, NF1, PALB2, PMS2, POLD1, POLE, PTEN, RAD50, RAD51C, RAD51D, SMAD4, SMARCA4, STK11, and TP53.  The report date is Jul 27, 2017.      08/27/2017 Surgery    Left lumpectomy: IDC grade 2, 2.2 cm, low-grade DCIS, lymphovascular invasion identified, margins negative, 1 lymph node with micrometastatic disease, 3 additional lymph nodes negative, ER 95%, PR 90%, Ki-67 15%, HER-2 negative ratio 1.26, T2N1 mic stage Ib      09/03/2017 Cancer Staging    Staging form: Breast, AJCC 8th Edition - Pathologic: Stage IB (pT2, pN57m, cM0, G2, ER+, PR+, HER2-) - Signed by GNicholas Lose MD on 09/03/2017       CHIEF COMPLIANT: Follow-up after recent left lumpectomy  INTERVAL HISTORY: Gloria RIECKEis a 52year old with above-mentioned history of left breast cancer treated with lumpectomy and is here today to discuss pathology report.  She  has 1 micrometastatic lymph node positive for breast cancer.  She is experiencing pain and discomfort in the axilla.  REVIEW OF SYSTEMS:   Constitutional: Denies fevers, chills or abnormal weight loss Eyes: Denies blurriness of vision Ears, nose, mouth, throat, and face: Denies mucositis or sore throat Respiratory: Denies cough, dyspnea or wheezes Cardiovascular: Denies palpitation, chest discomfort Gastrointestinal:  Denies nausea, heartburn or change in bowel habits Skin: Denies abnormal skin rashes Lymphatics: Denies new lymphadenopathy or easy bruising Neurological:Denies numbness, tingling or new weaknesses Behavioral/Psych: Mood is stable, no new changes  Extremities: No lower extremity edema Breast: Pain and discomfort in the axilla All other systems were reviewed with the patient and are negative.  I have reviewed the past medical history, past surgical history, social history and family history with the patient and they are unchanged from previous note.  ALLERGIES:  is allergic to penicillins.  MEDICATIONS:  Current Outpatient Medications  Medication Sig Dispense Refill  . b complex vitamins capsule Take 1 capsule by mouth daily.    .Marland KitchenHYDROcodone-acetaminophen (NORCO/VICODIN) 5-325 MG tablet Take 1-2 tablets by mouth every 6 (six) hours as needed for moderate pain or severe pain. 15 tablet 0  . Polysaccharide Iron Complex (NOVAFERRUM 50) 50 MG CAPS Take 50 mg by mouth daily. 90 capsule   . SYNTHROID 112 MCG tablet Take 1 tablet (112 mcg total) by mouth daily before breakfast. 90 tablet 1   No current facility-administered medications for this visit.     PHYSICAL EXAMINATION: ECOG PERFORMANCE STATUS: 1 -  Symptomatic but completely ambulatory  Vitals:   09/03/17 1154  BP: 130/81  Pulse: 75  Resp: 18  Temp: 97.7 F (36.5 C)  SpO2: 100%   Filed Weights   09/03/17 1154  Weight: 121 lb 9.6 oz (55.2 kg)    GENERAL:alert, no distress and comfortable SKIN: skin  color, texture, turgor are normal, no rashes or significant lesions EYES: normal, Conjunctiva are pink and non-injected, sclera clear OROPHARYNX:no exudate, no erythema and lips, buccal mucosa, and tongue normal  NECK: supple, thyroid normal size, non-tender, without nodularity LYMPH:  no palpable lymphadenopathy in the cervical, axillary or inguinal LUNGS: clear to auscultation and percussion with normal breathing effort HEART: regular rate & rhythm and no murmurs and no lower extremity edema ABDOMEN:abdomen soft, non-tender and normal bowel sounds MUSCULOSKELETAL:no cyanosis of digits and no clubbing  NEURO: alert & oriented x 3 with fluent speech, no focal motor/sensory deficits EXTREMITIES: No lower extremity edema  LABORATORY DATA:  I have reviewed the data as listed CMP Latest Ref Rng & Units 07/21/2014  Glucose 70 - 99 mg/dL 98  BUN 6 - 23 mg/dL 16  Creatinine 0.50 - 1.10 mg/dL 0.74  Sodium 135 - 145 mEq/L 138  Potassium 3.5 - 5.3 mEq/L 4.1  Chloride 96 - 112 mEq/L 104  CO2 19 - 32 mEq/L 21  Calcium 8.4 - 10.5 mg/dL 9.5  Total Protein 6.0 - 8.3 g/dL 7.2  Total Bilirubin 0.2 - 1.2 mg/dL 1.3(H)  Alkaline Phos 39 - 117 U/L 55  AST 0 - 37 U/L 17  ALT 0 - 35 U/L 12    Lab Results  Component Value Date   WBC 3.9 07/10/2017   HGB 13.1 07/10/2017   HCT 38.8 07/10/2017   MCV 89.5 07/10/2017   PLT 222 07/10/2017   NEUTROABS 3.0 07/10/2017    ASSESSMENT & PLAN:  Malignant neoplasm of upper-inner quadrant of left breast in female, estrogen receptor positive ([name]) 08/27/2017:Left lumpectomy: IDC grade 2, 2.2 cm, low-grade DCIS, lymphovascular invasion identified, margins negative, 1 lymph node with micrometastatic disease, 3 additional lymph nodes negative, ER 95%, PR 90%, Ki-67 15%, HER-2 negative ratio 1.26, T2N1 mic stage Ib  Pathology counseling: I discussed the final pathology report of the patient provided  a copy of this report. I discussed the margins as well as lymph node  surgeries. We also discussed the final staging along with previously performed ER/PR and HER-2/neu testing.  Treatment plan: 1. Oncotype DX testing to determine if chemotherapy would be of any benefit followed by 2. Adjuvant radiation therapy followed by 3. Adjuvant antiestrogen therapy  Return to clinic based upon Oncotype DX testing    No orders of the defined types were placed in this encounter.  The patient has a good understanding of the overall plan. she agrees with it. she will call with any problems that may develop before the next visit here.   Harriette Ohara, MD 09/03/17

## 2017-09-03 NOTE — Telephone Encounter (Signed)
Received order for oncotype testing. Requisition faxed to pathology. Received by Keisha 

## 2017-09-04 ENCOUNTER — Other Ambulatory Visit: Payer: 59

## 2017-09-04 ENCOUNTER — Encounter: Payer: 59 | Admitting: Genetics

## 2017-09-12 ENCOUNTER — Telehealth: Payer: Self-pay | Admitting: *Deleted

## 2017-09-12 ENCOUNTER — Encounter: Payer: Self-pay | Admitting: *Deleted

## 2017-09-12 DIAGNOSIS — C50212 Malignant neoplasm of upper-inner quadrant of left female breast: Secondary | ICD-10-CM

## 2017-09-12 DIAGNOSIS — Z170 Estrogen receptor positive status [ER+]: Principal | ICD-10-CM

## 2017-09-12 NOTE — Telephone Encounter (Signed)
Received oncotype score of 17/5%. Physician team notified. Left vm for pt informing of low score and no chemo.  Referral placed for xrt with Dr. Lisbeth Renshaw.

## 2017-09-13 ENCOUNTER — Encounter: Payer: Self-pay | Admitting: Radiation Oncology

## 2017-09-13 ENCOUNTER — Encounter (HOSPITAL_COMMUNITY): Payer: Self-pay | Admitting: Hematology and Oncology

## 2017-09-13 ENCOUNTER — Encounter: Payer: Self-pay | Admitting: *Deleted

## 2017-09-26 NOTE — Progress Notes (Signed)
Location of Breast Cancer: Upper-inner quadrant of left breast in female, Invasive ductal carcinoma of left breast.  Mammogram 07/03/2017: Suspicious mass 10 O'clock position left breast 3 cm from the nipple measuring 1.6 x 1.8 x 2.5 cm mass.  Histology per Pathology Report:   07/09/2017 Left Breast   Receptor Status: ER(+ 95%), PR (+ 90%), Her2-neu (-), Ki-67(15%)  Did patient present with symptoms (if so, please note symptoms) or was this found on screening mammography?:  She had an abnormal mammogram on 07/03/2017.  Past/Anticipated interventions by surgeon, if any:  FINAL DIAGNOSIS Diagnosis 08-27-17 Dr. Autumn Messing  1. Lymph node, sentinel, biopsy, Left Axillary #1 - MICROMETASTATIC CARCINOMA IN 1 OF 1 LYMPH NODE (1MIC/1). 2. Lymph node, sentinel, biopsy, Left Axillary #2 - THERE IS NO EVIDENCE OF CARCINOMA IN 1 OF 1 LYMPH NODE (0/1). 3. Lymph node, sentinel, biopsy, Left Axillary #3 - THERE IS NO EVIDENCE OF CARCINOMA IN 1 OF 1 LYMPH NODE (0/1). 4. Lymph node, sentinel, biopsy, Left Axillary #4 - THERE IS NO EVIDENCE OF CARCINOMA IN 1 OF 1 LYMPH NODE (0/1). 5. Breast, lumpectomy, Left - INVASIVE DUCTAL CARCINOMA, GRADE II/III, SPANNING 2.2 CM. - DUCTAL CARCINOMA IN SITU, LOW GRADE. - LYMPHOVASCULAR INVASION IS IDENTIFIED. - THE SURGICAL RESECTION MARGINS ARE NEGATIVE FOR CARCINOMA. - SEE ONCOLOGY TABLE BELOW. Microscopic Comment 5. INVASIVE CARCINOMA OF THE BREAST: Resection Procedure: Lumpectomy.  Receptor Status: ER( 95% +), PR ( 90% +), Her2-neu (-ratio was 1.26 ), Ki-67(15%)   Past/Anticipated interventions by medical oncology, if any: Dr. Lindi Adie  09-12-17 Chemotherapy  low score and no chemotherapy   09-03-17 Adjuvant radiation therapy followed by  Adjuvant antiestrogen therapy  08-27-17 Oncotype DX score result 17   Plastic surgeon appointment on 07/24/2017 3 pm.  07-09-17 Genetic Testing CHEK2 p.M381V VUS identified on the CancerNext panel.  The  CancerNext gene panel offered by [employer] includes sequencing and rearrangement analysis for the following 34 genes:   APC, ATM, BARD1, BMPR1A, BRCA1, BRCA2, BRIP1, CDH1, CDK4, CDKN2A, CHEK2, DICER1, HOXB13, EPCAM, GREM1, MLH1, MRE11A, MSH2, MSH6, MUTYH, NBN, NF1, PALB2, PMS2, POLD1, POLE, PTEN, RAD50, RAD51C, RAD51D, SMAD4, SMARCA4, STK11, and TP53.  The report date is Jul 27, 2017.  Lymphedema issues, if any: No 10-01-17 Saw a physical therapist for evaluation was given home exercises to do, plans to order a sleeve will meet 10-22-17 and will have a follow up visit post radiation.  ROM to left arm is altered she had a band of scar tissue under the left axilla. Skin to left breast with out signs of infection still has tenderness and numbness.         Follow up appointment with Dr. Autumn Messing 09-20-17 and he send asked that she have the PR evaluation.    Pain issues, if any: No  SAFETY ISSUES:  Prior radiation? No  Pacemaker/ICD? No  Possible current pregnancy? No  Is the patient on methotrexate? No  Current Complaints / other details:  07-24-17 Saw Dr. Audelia Hives in [facility] Mrs. Veldhuizen has decided not to have breast reconstruction. Wt Readings from Last 3 Encounters:  10/02/17 120 lb 9.6 oz (54.7 kg)  09/03/17 121 lb 9.6 oz (55.2 kg)  08/27/17 122 lb 3.2 oz (55.4 kg)   BP 127/81 (BP Location: Right Arm, Patient Position: Sitting, Cuff Size: Normal)   Pulse 70   Temp 97.8 F (36.6 C)   Resp 20   Ht '5\' 8"'$  (1.727 m)   Wt 120 lb 9.6 oz (  54.7 kg)   SpO2 100%   BMI 18.34 kg/m

## 2017-09-30 ENCOUNTER — Encounter: Payer: Self-pay | Admitting: Family Medicine

## 2017-09-30 ENCOUNTER — Other Ambulatory Visit: Payer: Self-pay

## 2017-09-30 MED ORDER — LEVOTHYROXINE SODIUM 112 MCG PO TABS: 112.0000 ug | ORAL_TABLET | Freq: Every day | ORAL | 1 refills | Status: DC

## 2017-10-01 ENCOUNTER — Encounter: Payer: Self-pay | Admitting: Rehabilitation

## 2017-10-01 ENCOUNTER — Ambulatory Visit: Payer: POS | Attending: General Surgery | Admitting: Rehabilitation

## 2017-10-01 ENCOUNTER — Other Ambulatory Visit: Payer: Self-pay

## 2017-10-01 DIAGNOSIS — M25612 Stiffness of left shoulder, not elsewhere classified: Secondary | ICD-10-CM | POA: Insufficient documentation

## 2017-10-01 DIAGNOSIS — Z483 Aftercare following surgery for neoplasm: Secondary | ICD-10-CM | POA: Diagnosis present

## 2017-10-01 DIAGNOSIS — Z9189 Other specified personal risk factors, not elsewhere classified: Secondary | ICD-10-CM

## 2017-10-01 DIAGNOSIS — Z170 Estrogen receptor positive status [ER+]: Secondary | ICD-10-CM | POA: Diagnosis present

## 2017-10-01 DIAGNOSIS — C50212 Malignant neoplasm of upper-inner quadrant of left female breast: Secondary | ICD-10-CM

## 2017-10-01 NOTE — Therapy (Signed)
[place], [place], [address] Phone: [phone]   Fax:  [phone]  Physical Therapy Evaluation  Patient Details  Name: Gloria Kramer MRN: [id number] Date of Birth: November 30, 1965 Referring Provider: Dr. Marlou Starks   Encounter Date: 10/01/2017  PT End of Session - 10/01/17 1708    Visit Number  1    Number of Visits  4    Date for PT Re-Evaluation  11/26/17    PT Start Time  0321    PT Stop Time  1430    PT Time Calculation (min)  43 min    Activity Tolerance  Patient tolerated treatment well    Behavior During Therapy  [facility] for tasks assessed/performed       Past Medical History:  Diagnosis Date  . Anemia    iron deficiency from heavy periods  . Cancer ([place]) 07/2017   left breast cancer  . Family history of brain cancer   . Family history of breast cancer   . Hypothyroidism    Hoshimoto's thyroiditis  . Thyroid disease     Past Surgical History:  Procedure Laterality Date  . BREAST LUMPECTOMY WITH AXILLARY LYMPH NODE BIOPSY Left 08/27/2017   Procedure: LEFT BREAST LUMPECTOMY WITH AXILLARY LYMPH NODE BIOPSY;  Surgeon: Jovita Kussmaul, MD;  Location: [place];  Service: General;  Laterality: Left;  . WISDOM TOOTH EXTRACTION      There were no vitals filed for this visit.   Subjective Assessment - 10/01/17 1352    Subjective  Noticing some decreased Lt shoulder ROM and pulling into the chest "Surgeon saw some cording in the armpit".  Radiation consult tomorrow     Limitations  Lifting    Patient Stated Goals  improve ROM     Currently in Pain?  No/denies         [facility] PT Assessment - 10/01/17 0001      Assessment   Medical Diagnosis  Lt breast cancer     Referring Provider  Dr. Marlou Starks    Onset Date/Surgical Date  08/27/17    Hand Dominance  Right    Next MD Visit  tomorrow      Precautions   Precaution Comments  cancer      Restrictions   Weight Bearing Restrictions   No    Other Position/Activity Restrictions  n      Balance Screen   Has the patient fallen in the past 6 months  No    Has the patient had a decrease in activity level because of a fear of falling?   No    Is the patient reluctant to leave their home because of a fear of falling?   No      Home Film/video editor residence    Living Arrangements  Spouse/significant other    Available Help at Discharge  Family    Type of [place]    Additional Comments  5 children      Prior Function   Level of Independence  Independent    Vocation  Unemployed    Leisure  pool, will be doing weight training       Cognition   Overall Cognitive Status  Within Functional Limits for tasks assessed      Observation/Other Assessments   Observations  healing incision with some scabbing still present lt breast from nipple horizontally to the sternum      Sensation  Additional Comments  reports numbness Lt underarm      ROM / Strength   AROM / PROM / Strength  AROM;PROM;Strength      AROM   AROM Assessment Site  Shoulder    Right/Left Shoulder  Right;Left    Right Shoulder Flexion  150 Degrees    Right Shoulder ABduction  170 Degrees    Right Shoulder Internal Rotation  90 Degrees    Right Shoulder External Rotation  90 Degrees    Left Shoulder Flexion  145 Degrees    Left Shoulder ABduction  150 Degrees    Left Shoulder Internal Rotation  90 Degrees    Left Shoulder External Rotation  90 Degrees      PROM   Overall PROM Comments  L PROM into flexion, abduction, and horizontal abduction Lt without cording evident and no significant pulling    PROM Assessment Site  Shoulder    Right/Left Shoulder  Right;Left      Strength   Overall Strength Comments  excellent and equal bil        LYMPHEDEMA/ONCOLOGY QUESTIONNAIRE - 10/01/17 1358      Type   Cancer Type  Lt DCIS breast      Surgeries   Lumpectomy Date  08/27/17    Sentinel Lymph Node Biopsy Date  08/27/17     Number Lymph Nodes Removed  4 1/4 positive      Treatment   Active Chemotherapy Treatment  No    Past Chemotherapy Treatment  No    Active Radiation Treatment  No    Past Radiation Treatment  No    Current Hormone Treatment  No    Past Hormone Therapy  No      What other symptoms do you have   Are you Having Heaviness or Tightness  No    Are you having Pain  No      Lymphedema Assessments   Lymphedema Assessments  Upper extremities      Right Upper Extremity Lymphedema   15 cm Proximal to Olecranon Process  24.8 cm    10 cm Proximal to Olecranon Process  24.5 cm    Olecranon Process  22.7 cm    15 cm Proximal to Ulnar Styloid Process  21.5 cm    10 cm Proximal to Ulnar Styloid Process  18.2 cm    Just Proximal to Ulnar Styloid Process  15.5 cm    Across Hand at [employer]  18.8 cm    At [place] of 2nd Digit  5.8 cm      Left Upper Extremity Lymphedema   15 cm Proximal to Olecranon Process  24 cm    10 cm Proximal to Olecranon Process  23.8 cm    Olecranon Process  23 cm    15 cm Proximal to Ulnar Styloid Process  20.5 cm    10 cm Proximal to Ulnar Styloid Process  17.7 cm    Just Proximal to Ulnar Styloid Process  15 cm    Across Hand at [employer]  19.5 cm    At [place] of 2nd Digit  6 cm          Quick Dash - 10/01/17 0001    Open a tight or new jar  Mild difficulty    Do heavy household chores (wash walls, wash floors)  No difficulty    Carry a shopping bag or briefcase  No difficulty    Wash your back  Mild difficulty  Use a knife to cut food  No difficulty    Recreational activities in which you take some force or impact through your arm, shoulder, or hand (golf, hammering, tennis)  Unable    During the past week, to what extent has your arm, shoulder or hand problem interfered with your normal social activities with family, friends, neighbors, or groups?  Not at all    During the past week, to what extent has your arm, shoulder or hand problem limited  your work or other regular daily activities  Slightly    Arm, shoulder, or hand pain.  None    Tingling (pins and needles) in your arm, shoulder, or hand  None    Difficulty Sleeping  No difficulty    DASH Score  15.91 %        Objective measurements completed on examination: See above findings.              PT Education - 10/01/17 1707    Education Details  scar massage instructions, post op breast exercises, lymphedema precautions, use of compression, Livest[employer] program at the [facility]) Educated  Patient    Methods  Explanation;Verbal cues;Handout    Comprehension  Verbalized understanding          PT Long Term Goals - 10/01/17 1716      PT LONG TERM GOAL #1   Title  Pt will improve Lt shoulder active abduction to 170 to equal the opposite arm    Time  8    Period  Weeks    Status  New    Target Date  11/26/17      PT LONG TERM GOAL #2   Title  Pt will be knowledgeable about lymphedema precautions and lifestyle modifications to prevent onset    Time  8    Period  Weeks    Status  New    Target Date  11/26/17      PT LONG TERM GOAL #3   Title  Pt will understand where to obtain appopriate compression garments    Time  8    Period  Weeks    Status  New    Target Date  11/26/17             Plan - 10/01/17 1709    Clinical Impression Statement  Adela presents s/p Lt lumpectomy with SLNB on 08/27/17, with 1/4 nodes positive with concerns that her shoulder has decreased ROM.  She was initially told she had cording in the axilla by her MD but she reports she thinks this is gone after working it out in the pool, and no cording evident during examination today.  She does have some decreased ROM into flexion and abduction with pulling into the Lt breast but more due to tight tissue at the surgical site.  Her strength is excellent in seated but she would like to be educated on starting to lift weights.  She is able to obtain the radiation position without  increased pain but with some stretching.  She is also interested in getting a compression garment.      History and Personal Factors relevant to plan of care:  Left lumpectomy: IDC grade 2, 2.2 cm, low-grade DCIS, lymphovascular invasion identified, margins negative, 1 lymph node with micrometastatic disease, 3 additional lymph nodes negative, ER 95%, PR 90%, Ki-67 15%, HER-2 negative ratio 1.26, T2N1 mic stage Ib, recent iron transfusion due to anemia    Clinical Presentation  Evolving    Clinical Presentation due to:  new post op status    Clinical Decision Making  Moderate    Rehab Potential  Excellent    PT Frequency  -- will check in about 3 weeks into radiation     PT Duration  8 weeks    PT Treatment/Interventions  ADLs/Self Care Home Management;DME Instruction;Therapeutic exercise;Patient/family education;Prosthetic Training;Manual techniques;Manual lymph drainage;Passive range of motion;Taping    PT Next Visit Plan  Check UE ROM, need more visits for this? Educated on strength ABC and return to weight training, referral back for compression if not pt can take to MD office?    PT Home Exercise Plan  given post op exercises 8/5    Recommended Other Services  sleeve and gauntlet class 1       Patient will benefit from skilled therapeutic intervention in order to improve the following deficits and impairments:  Decreased knowledge of use of DME, Increased fascial restricitons, Decreased mobility, Decreased activity tolerance, Decreased range of motion, Decreased knowledge of precautions  Visit Diagnosis: Malignant neoplasm of upper-inner quadrant of left breast in female, estrogen receptor positive [facility])  Aftercare following surgery for neoplasm  Stiffness of left shoulder, not elsewhere classified  At risk for lymphedema     Problem List Patient Active Problem List   Diagnosis Date Noted  . Genetic testing 07/30/2017  . Family history of breast cancer   . Family history of  brain cancer   . Malignant neoplasm of upper-inner quadrant of left breast in female, estrogen receptor positive ([place]) 07/18/2017  . Iron deficiency anemia 03/08/2017  . Hypothyroid 01/16/2011  . Perimenopause 01/16/2011    Shan Levans, PT 10/01/2017, 5:19 PM  [place], [place], [address] Phone: [phone]   Fax:  [phone]  Name: CYPRESS HINKSON MRN: [id number] Date of Birth: 1965-11-13

## 2017-10-01 NOTE — Patient Instructions (Signed)
Scar Massage  Scar massage is done to improve the mobility of scar, decrease scar tissue from building up, reduce adhesions, and prevent Keloids from forming. Start scar massage after scabs have fallen off by themselves and no open areas. The first few weeks after surgery, it is normal for a scar to appear pink or red and slightly raised. Scars can itch or have areas of numbness. Some scars may be sensitive.   Direct Scar massage: after scar is healed, no opening, no scab 1.  Place pads of two fingers together directly on the scar starting at one end of the scar. Move the fingers up and down across the scar holding 5 seconds one direction.  Then go opposite direction hold 5 seconds.  2. Move over to the next section of the scar and repeat.  Work your way along the entire length of the scar.   3. Next make diagonal movements along the scar holding 5 seconds at one direction. 4. Next movement is side to side. 5. Do not rub fingers over the scar.  Instead keep firm pressure and move scar over the tissue it is on top   Scar Lift and Roll 12 weeks after surgery. 1. Pinch a small amount of the scar between your first two fingers and thumb.  2. Roll the scar between your fingers for 5 to 15 seconds. 3. Move along the scar and repeat until you have massaged the entire length of scar.   Stop the massage and call your doctor if you notice: 1. Increased redness 2. Bleeding from scar 3. Seepage coming from the scar 4. Scar is warmer and has increased pain    

## 2017-10-02 ENCOUNTER — Ambulatory Visit
Admission: RE | Admit: 2017-10-02 | Discharge: 2017-10-02 | Disposition: A | Discharge status: Home / Self Care | Payer: POS | Source: Ambulatory Visit | Attending: Radiation Oncology | Admitting: Radiation Oncology

## 2017-10-02 ENCOUNTER — Encounter: Payer: Self-pay | Admitting: Radiation Oncology

## 2017-10-02 ENCOUNTER — Other Ambulatory Visit: Payer: Self-pay

## 2017-10-02 VITALS — BP 127/81 | HR 70 | Temp 97.8°F | Resp 20 | Ht 68.0 in | Wt 120.6 lb

## 2017-10-02 DIAGNOSIS — Z510 Encounter for antineoplastic radiation therapy: Secondary | ICD-10-CM | POA: Insufficient documentation

## 2017-10-02 DIAGNOSIS — Z170 Estrogen receptor positive status [ER+]: Secondary | ICD-10-CM | POA: Insufficient documentation

## 2017-10-02 DIAGNOSIS — E039 Hypothyroidism, unspecified: Secondary | ICD-10-CM | POA: Insufficient documentation

## 2017-10-02 DIAGNOSIS — Z880 Allergy status to penicillin: Secondary | ICD-10-CM | POA: Diagnosis not present

## 2017-10-02 DIAGNOSIS — C50212 Malignant neoplasm of upper-inner quadrant of left female breast: Secondary | ICD-10-CM | POA: Insufficient documentation

## 2017-10-02 DIAGNOSIS — Z79890 Hormone replacement therapy (postmenopausal): Secondary | ICD-10-CM | POA: Insufficient documentation

## 2017-10-02 NOTE — Addendum Note (Signed)
Encounter addended by: Malena Edman, RN on: 10/02/2017 2:39 PM  Actions taken: Charge Capture section accepted

## 2017-10-02 NOTE — Progress Notes (Addendum)
Radiation Oncology         [phone] ________________________________  Name: Gloria Kramer        MRN: [id number]  Date of Service: 10/02/2017 DOB: [birth year]  XH:[id number], Laurey Arrow, MD  Nicholas Lose, MD     REFERRING PHYSICIAN: Nicholas Lose, MD   DIAGNOSIS: The encounter diagnosis was Malignant neoplasm of upper-inner quadrant of left breast in female, estrogen receptor positive ([place]).   HISTORY OF PRESENT ILLNESS: Gloria Kramer is a 52 y.o. female with a newly diagnosed left breast cancer. She had a mass in the left breast and diagnostic imaging revealed a 2 cm mass in the upper inner quadrant. She underwent an ultrasound that at 10:00 position, she revealed a 1.6 x 1.8 x 2.5 cm mass with internal microcalcifications and some skin thickening, and her axilla was negative for adenopathy. She did have a biopsy on 07/09/17 which revealed a grade 2 invasive ductal carcinoma, ER/PR positive, HER 2 negative with a Ki 67 of 15%. She underwent lumpectomy and sentinel node sampling after genetics showed CHEK2 mutation VUS. Her surgery was performed on 08/27/17 and her tumor was a 2.2 cm, invasive ductal carcinoma, grade 2, with low-grade DCIS.  Her margins were negative and 1 of the 4 sampled nodes contained micrometastases this was labeled #1 of the lymph nodes submitted.  Her Oncotype score was 17 and she will not receive any chemotherapy.  She comes today to discuss adjuvant radiotherapy.  PREVIOUS RADIATION THERAPY: No   PAST MEDICAL HISTORY:  Past Medical History:  Diagnosis Date  . Anemia    iron deficiency from heavy periods  . Cancer ([place]) 07/2017   left breast cancer  . Family history of brain cancer   . Family history of breast cancer   . Hypothyroidism    Hoshimoto's thyroiditis  . Thyroid disease        PAST SURGICAL HISTORY: Past Surgical History:  Procedure Laterality Date  . BREAST LUMPECTOMY WITH AXILLARY LYMPH NODE BIOPSY Left 08/27/2017   Procedure: LEFT BREAST LUMPECTOMY  WITH AXILLARY LYMPH NODE BIOPSY;  Surgeon: Jovita Kussmaul, MD;  Location: [place];  Service: General;  Laterality: Left;  . WISDOM TOOTH EXTRACTION       FAMILY HISTORY:  Family History  Problem Relation Age of Onset  . Stroke Mother   . Breast cancer Mother   . Bladder Cancer Father   . Heart disease Maternal Grandmother        d. 23  . Brain cancer Maternal Grandfather        d. in his 49s  . Hypothyroidism Sister   . Dementia Paternal Aunt   . Breast cancer Cousin 84       mat first cousin  . Hashimoto's thyroiditis Cousin   . Lupus Cousin        mat first cousin  . Hashimoto's thyroiditis Son 12     SOCIAL HISTORY:  reports that she has never smoked. She has never used smokeless tobacco. She reports that she does not drink alcohol or use drugs. The patient is married and lives in [place]. She has five children.   ALLERGIES: Penicillins   MEDICATIONS:  Current Outpatient Medications  Medication Sig Dispense Refill  . b complex vitamins capsule Take 1 capsule by mouth daily.    Marland Kitchen levothyroxine (SYNTHROID, LEVOTHROID) 112 MCG tablet Take 1 tablet (112 mcg total) by mouth daily before breakfast. 90 tablet 1  . Polysaccharide Iron Complex (NOVAFERRUM 50) 50  MG CAPS Take 50 mg by mouth daily. 90 capsule   . HYDROcodone-acetaminophen (NORCO/VICODIN) 5-325 MG tablet Take 1-2 tablets by mouth every 6 (six) hours as needed for moderate pain or severe pain. (Patient not taking: Reported on 10/02/2017) 15 tablet 0   No current facility-administered medications for this encounter.      REVIEW OF SYSTEMS: On review of systems, the patient reports that she is doing well overall. She's had some cording of her axilla after her surgery but reports she saw PT and this has resolved. She reports good ROM now. She does have some numbness along her left posterior upper arm and chest wall medially since surgery. She denies any sternal chest pain, shortness of breath, cough,  fevers, chills, night sweats, unintended weight changes. She denies any bowel or bladder disturbances, and denies abdominal pain, nausea or vomiting. She denies any new musculoskeletal or joint aches or pains. A complete review of systems is obtained and is otherwise negative.     PHYSICAL EXAM:  Wt Readings from Last 3 Encounters:  10/02/17 120 lb 9.6 oz (54.7 kg)  09/03/17 121 lb 9.6 oz (55.2 kg)  08/27/17 122 lb 3.2 oz (55.4 kg)   Temp Readings from Last 3 Encounters:  10/02/17 97.8 F (36.6 C)  09/03/17 97.7 F (36.5 C) (Oral)  08/27/17 (!) 97.5 F (36.4 C)   BP Readings from Last 3 Encounters:  10/02/17 127/81  09/03/17 130/81  08/27/17 126/81   Pulse Readings from Last 3 Encounters:  10/02/17 70  09/03/17 75  08/27/17 70   Pain Assessment Pain Score: 0-No pain/10  In general this is a well appearing caucasian female in no acute distress. She is alert and oriented x4 and appropriate throughout the examination. HEENT reveals that the patient is normocephalic, atraumatic. EOMs are intact. Skin is intact without any evidence of gross lesions. Cardiopulmonary assessment is negative for acute distress and she exhibits normal effort. Breast exam is deferred.   ECOG = 1  0 - Asymptomatic (Fully active, able to carry on all predisease activities without restriction)  1 - Symptomatic but completely ambulatory (Restricted in physically strenuous activity but ambulatory and able to carry out work of a light or sedentary nature. For example, light housework, office work)  2 - Symptomatic, <50% in bed during the day (Ambulatory and capable of all self care but unable to carry out any work activities. Up and about more than 50% of waking hours)  3 - Symptomatic, >50% in bed, but not bedbound (Capable of only limited self-care, confined to bed or chair 50% or more of waking hours)  4 - Bedbound (Completely disabled. Cannot carry on any self-care. Totally confined to bed or  chair)  5 - Death   Eustace Pen MM, Creech RH, Tormey DC, et al. [phone]). "Toxicity and response criteria of the [facility]". [place] Oncol. 5 (6): 649-55    LABORATORY DATA:  Lab Results  Component Value Date   WBC 3.9 07/10/2017   HGB 13.1 07/10/2017   HCT 38.8 07/10/2017   MCV 89.5 07/10/2017   PLT 222 07/10/2017   Lab Results  Component Value Date   NA 140 01/25/2017   K 4.6 01/25/2017   CL 104 07/21/2014   CO2 21 07/21/2014   Lab Results  Component Value Date   ALT 12 07/21/2014   AST 17 07/21/2014   ALKPHOS 55 07/21/2014   BILITOT 1.3 (H) 07/21/2014      RADIOGRAPHY: No results  found.     IMPRESSION/PLAN: 1. 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 Dr. MLisbeth Renshawdiscusses the final pathology findings and reviews the nature of invasive left breast disease. She is ready to proceed with radiotherapy and does not require chemotherapy. Dr. MLisbeth Renshawoutlined that despite the micrometastasis classification, and given her large tumor, we would anticipate a course of definitive radiotherapy to the breast and regional nodes rather than high tangents. We discussed the risks, benefits, short, and long term effects of radiotherapy, and the patient is interested in proceeding. Dr. MLisbeth Renshawdiscusses the delivery and logistics of radiotherapy and recommends a course of 6 1/2 weeks of radiotherapy to the breast and regional nodes with deep inspiration breath hold technique. Written consent is obtained and placed in the chart, a copy was provided to the patient. She will proceed with simulation today. 2. Contraceptive counseling. The patient reports she has had recent menses a few months ago, and declines hcg testing prior to CT imaging as she states there is "no chance" she could be pregnant.  In a visit lasting 45 minutes, greater than 50% of the time was spent face to face discussing her case, and coordinating the patient's  care.  The above documentation reflects my direct findings during this shared patient visit. Please see the separate note by Dr. MLisbeth Renshawon this date for the remainder of the patient's plan of care.    ACarola Rhine PAC

## 2017-10-03 NOTE — Progress Notes (Signed)
  Radiation Oncology         [phone] ________________________________  Name: Gloria Kramer MRN: [id number]  Date: 10/02/2017  DOB: 1966/02/21  Diagnosis DIAGNOSIS:     ICD-10-CM   1. Malignant neoplasm of upper-inner quadrant of left breast in female, estrogen receptor positive ([place]) C50.212    Z17.0      SIMULATION AND TREATMENT PLANNING NOTE  The patient presented for simulation prior to beginning her course of radiation treatment for her diagnosis of left-sided breast cancer. The patient was placed in a supine position on a breast board. A customized vac-lock bag was also constructed and this complex treatment device will be used on a daily basis during her treatment. In this fashion, a CT scan was obtained through the chest area and an isocenter was placed near the chest wall at the upper aspect of the right chest. A breath-hold technique has also been evaluated to determine if this significantly improves the spatial relationship between the target region and the heart. Based on this analysis, a breath-hold technique has been ordered for the patient's treatment.  The patient will be planned to receive a course of radiation initially to a dose of 50.4 gray. This will consist of a 4 field technique targeting the left breast as well as the supraclavicular region. Therefore 2 customized medial and lateral tangent fields have been created targeting the chest wall, and also 2 additional customized fields have been designed to treat the supraclavicular region both with a left supraclavicular field and a left posterior axillary boost field. A forward planning/reduced field technique will also be evaluated to determine if this significantly improves the dose homogeneity of the overall plan. Therefore, additional customized blocks/fields may be necessary.  This initial treatment will be accomplished at 1.8 gray per fraction.   The initial plan will consist of a 3-D conformal technique. The target  volume/scar, heart and lungs have been contoured and dose volume histograms of each of these structures will be evaluated as part of the 3-D conformal treatment planning process.   It is anticipated that the patient will then receive a 10 gray boost to the surgical scar. This will be accomplished at 2 gray per fraction. The final anticipated total dose therefore will correspond to 60.4 gray.   Special treatment procedure was performed today due to the extra time and effort required by myself to plan and prepare this patient for deep inspiration breath hold technique.  I have determined cardiac sparing to be of benefit to this patient to prevent long term cardiac damage due to radiation of the heart.  Bellows were placed on the patient's abdomen. To facilitate cardiac sparing, the patient was coached by the radiation therapists on breath hold techniques and breathing practice was performed. Practice waveforms were obtained. The patient was then scanned while maintaining breath hold in the treatment position.  This image was then transferred over to the imaging specialist. The imaging specialist then created a fusion of the free breathing and breath hold scans using the chest wall as the stable structure. I personally reviewed the fusion in axial, coronal and sagittal image planes.  Excellent cardiac sparing was obtained.  I felt the patient is an appropriate candidate for breath hold and the patient will be treated as such.  The image fusion was then reviewed with the patient to reinforce the necessity of reproducible breath hold.     _______________________________   Jodelle Gross, MD, PhD

## 2017-10-03 NOTE — Progress Notes (Signed)
  Radiation Oncology         [phone]) [phone] ________________________________  Name: Gloria Kramer MRN: [id number]  Date: 10/02/2017  DOB: 04/09/1965  Optical Surface Tracking Plan:  Since intensity modulated radiotherapy (IMRT) and 3D conformal radiation treatment methods are predicated on accurate and precise positioning for treatment, intrafraction motion monitoring is medically necessary to ensure accurate and safe treatment delivery.  The ability to quantify intrafraction motion without excessive ionizing radiation dose can only be performed with optical surface tracking. Accordingly, surface imaging offers the opportunity to obtain 3D measurements of patient position throughout IMRT and 3D treatments without excessive radiation exposure.  I am ordering optical surface tracking for this patient's upcoming course of radiotherapy. ________________________________  Kyung Rudd, MD 10/03/2017 7:47 PM    Reference:   Particia Jasper, et al. Surface imaging-based analysis of intrafraction motion for breast radiotherapy patients.Journal of [place], n. 6, nov. 2014. ISSN [id number].   Available at: <http://www.jacmp.org/index.php/jacmp/article/view/4957>.

## 2017-10-04 DIAGNOSIS — C50212 Malignant neoplasm of upper-inner quadrant of left female breast: Secondary | ICD-10-CM | POA: Diagnosis not present

## 2017-10-09 ENCOUNTER — Ambulatory Visit
Admission: RE | Admit: 2017-10-09 | Discharge: 2017-10-09 | Disposition: A | Discharge status: Home / Self Care | Payer: POS | Source: Ambulatory Visit | Attending: Radiation Oncology | Admitting: Radiation Oncology

## 2017-10-09 DIAGNOSIS — C50212 Malignant neoplasm of upper-inner quadrant of left female breast: Secondary | ICD-10-CM | POA: Diagnosis not present

## 2017-10-10 ENCOUNTER — Ambulatory Visit
Admission: RE | Admit: 2017-10-10 | Discharge: 2017-10-10 | Disposition: A | Discharge status: Home / Self Care | Payer: POS | Source: Ambulatory Visit | Attending: Radiation Oncology | Admitting: Radiation Oncology

## 2017-10-10 ENCOUNTER — Telehealth: Payer: Self-pay | Admitting: Hematology and Oncology

## 2017-10-10 DIAGNOSIS — C50212 Malignant neoplasm of upper-inner quadrant of left female breast: Secondary | ICD-10-CM | POA: Diagnosis not present

## 2017-10-10 NOTE — Telephone Encounter (Signed)
Mailed patient calendar of upcoming sept appts per 8/8 sch message

## 2017-10-11 ENCOUNTER — Ambulatory Visit
Admission: RE | Admit: 2017-10-11 | Discharge: 2017-10-11 | Disposition: A | Discharge status: Home / Self Care | Payer: POS | Source: Ambulatory Visit | Attending: Radiation Oncology | Admitting: Radiation Oncology

## 2017-10-11 DIAGNOSIS — Z170 Estrogen receptor positive status [ER+]: Principal | ICD-10-CM

## 2017-10-11 DIAGNOSIS — C50212 Malignant neoplasm of upper-inner quadrant of left female breast: Secondary | ICD-10-CM | POA: Diagnosis not present

## 2017-10-11 MED ORDER — RADIAPLEXRX EX GEL
Freq: Once | CUTANEOUS | Status: AC
  Administered 2017-10-11: 16:00:00 via TOPICAL

## 2017-10-11 NOTE — Progress Notes (Signed)
Pt here for patient teaching.  Pt given Radiation and You booklet, skin care instructions, Alra deodorant and Radiaplex gel.  Reviewed areas of pertinence such as fatigue, hair loss and skin changes . Pt able to give teach back of to pat skin and use unscented/gentle soap,apply Radiaplex bid, avoid applying anything to skin within 4 hours of treatment and to use an electric razor if they must shave. Pt verbalizes understanding of information given and will contact nursing with any questions or concerns.     Stanlee Roehrig M. Stillman Buenger RN, BSN      

## 2017-10-12 ENCOUNTER — Ambulatory Visit
Admission: RE | Admit: 2017-10-12 | Discharge: 2017-10-12 | Disposition: A | Discharge status: Home / Self Care | Payer: POS | Source: Ambulatory Visit | Attending: Radiation Oncology | Admitting: Radiation Oncology

## 2017-10-12 DIAGNOSIS — C50212 Malignant neoplasm of upper-inner quadrant of left female breast: Secondary | ICD-10-CM | POA: Diagnosis not present

## 2017-10-14 ENCOUNTER — Ambulatory Visit: Admission: RE | Admit: 2017-10-14 | Payer: POS | Source: Ambulatory Visit

## 2017-10-15 ENCOUNTER — Ambulatory Visit
Admission: RE | Admit: 2017-10-15 | Discharge: 2017-10-15 | Disposition: A | Discharge status: Home / Self Care | Payer: POS | Source: Ambulatory Visit | Attending: Radiation Oncology | Admitting: Radiation Oncology

## 2017-10-15 DIAGNOSIS — C50212 Malignant neoplasm of upper-inner quadrant of left female breast: Secondary | ICD-10-CM | POA: Diagnosis not present

## 2017-10-16 ENCOUNTER — Ambulatory Visit
Admission: RE | Admit: 2017-10-16 | Discharge: 2017-10-16 | Disposition: A | Discharge status: Home / Self Care | Payer: POS | Source: Ambulatory Visit | Attending: Radiation Oncology | Admitting: Radiation Oncology

## 2017-10-16 DIAGNOSIS — C50212 Malignant neoplasm of upper-inner quadrant of left female breast: Secondary | ICD-10-CM | POA: Diagnosis not present

## 2017-10-17 ENCOUNTER — Ambulatory Visit
Admission: RE | Admit: 2017-10-17 | Discharge: 2017-10-17 | Disposition: A | Discharge status: Home / Self Care | Payer: POS | Source: Ambulatory Visit | Attending: Radiation Oncology | Admitting: Radiation Oncology

## 2017-10-17 DIAGNOSIS — C50212 Malignant neoplasm of upper-inner quadrant of left female breast: Secondary | ICD-10-CM | POA: Diagnosis not present

## 2017-10-18 ENCOUNTER — Ambulatory Visit
Admission: RE | Admit: 2017-10-18 | Discharge: 2017-10-18 | Disposition: A | Discharge status: Home / Self Care | Payer: POS | Source: Ambulatory Visit | Attending: Radiation Oncology | Admitting: Radiation Oncology

## 2017-10-18 DIAGNOSIS — C50212 Malignant neoplasm of upper-inner quadrant of left female breast: Secondary | ICD-10-CM | POA: Diagnosis not present

## 2017-10-19 ENCOUNTER — Ambulatory Visit
Admission: RE | Admit: 2017-10-19 | Discharge: 2017-10-19 | Disposition: A | Discharge status: Home / Self Care | Payer: POS | Source: Ambulatory Visit | Attending: Radiation Oncology | Admitting: Radiation Oncology

## 2017-10-19 ENCOUNTER — Encounter: Payer: Self-pay | Admitting: Rehabilitation

## 2017-10-19 DIAGNOSIS — C50212 Malignant neoplasm of upper-inner quadrant of left female breast: Secondary | ICD-10-CM | POA: Diagnosis not present

## 2017-10-22 ENCOUNTER — Ambulatory Visit: Payer: POS | Admitting: Rehabilitation

## 2017-10-22 ENCOUNTER — Ambulatory Visit
Admission: RE | Admit: 2017-10-22 | Discharge: 2017-10-22 | Disposition: A | Discharge status: Home / Self Care | Payer: POS | Source: Ambulatory Visit | Attending: Radiation Oncology | Admitting: Radiation Oncology

## 2017-10-22 DIAGNOSIS — C50212 Malignant neoplasm of upper-inner quadrant of left female breast: Secondary | ICD-10-CM | POA: Diagnosis not present

## 2017-10-23 ENCOUNTER — Ambulatory Visit
Admission: RE | Admit: 2017-10-23 | Discharge: 2017-10-23 | Disposition: A | Discharge status: Home / Self Care | Payer: POS | Source: Ambulatory Visit | Attending: Radiation Oncology | Admitting: Radiation Oncology

## 2017-10-23 DIAGNOSIS — C50212 Malignant neoplasm of upper-inner quadrant of left female breast: Secondary | ICD-10-CM | POA: Diagnosis not present

## 2017-10-24 ENCOUNTER — Ambulatory Visit
Admission: RE | Admit: 2017-10-24 | Discharge: 2017-10-24 | Disposition: A | Discharge status: Home / Self Care | Payer: POS | Source: Ambulatory Visit | Attending: Radiation Oncology | Admitting: Radiation Oncology

## 2017-10-24 DIAGNOSIS — C50212 Malignant neoplasm of upper-inner quadrant of left female breast: Secondary | ICD-10-CM | POA: Diagnosis not present

## 2017-10-25 ENCOUNTER — Ambulatory Visit
Admission: RE | Admit: 2017-10-25 | Discharge: 2017-10-25 | Disposition: A | Discharge status: Home / Self Care | Payer: POS | Source: Ambulatory Visit | Attending: Radiation Oncology | Admitting: Radiation Oncology

## 2017-10-25 DIAGNOSIS — C50212 Malignant neoplasm of upper-inner quadrant of left female breast: Secondary | ICD-10-CM | POA: Diagnosis not present

## 2017-10-26 ENCOUNTER — Ambulatory Visit
Admission: RE | Admit: 2017-10-26 | Discharge: 2017-10-26 | Disposition: A | Discharge status: Home / Self Care | Payer: POS | Source: Ambulatory Visit | Attending: Radiation Oncology | Admitting: Radiation Oncology

## 2017-10-26 DIAGNOSIS — C50212 Malignant neoplasm of upper-inner quadrant of left female breast: Secondary | ICD-10-CM | POA: Diagnosis not present

## 2017-10-30 ENCOUNTER — Ambulatory Visit
Admission: RE | Admit: 2017-10-30 | Discharge: 2017-10-30 | Disposition: A | Discharge status: Home / Self Care | Payer: POS | Source: Ambulatory Visit | Attending: Radiation Oncology | Admitting: Radiation Oncology

## 2017-10-30 DIAGNOSIS — Z170 Estrogen receptor positive status [ER+]: Secondary | ICD-10-CM | POA: Insufficient documentation

## 2017-10-30 DIAGNOSIS — Z510 Encounter for antineoplastic radiation therapy: Secondary | ICD-10-CM | POA: Diagnosis not present

## 2017-10-30 DIAGNOSIS — C50212 Malignant neoplasm of upper-inner quadrant of left female breast: Secondary | ICD-10-CM | POA: Insufficient documentation

## 2017-10-31 ENCOUNTER — Ambulatory Visit
Admission: RE | Admit: 2017-10-31 | Discharge: 2017-10-31 | Disposition: A | Discharge status: Home / Self Care | Payer: POS | Source: Ambulatory Visit | Attending: Radiation Oncology | Admitting: Radiation Oncology

## 2017-10-31 DIAGNOSIS — C50212 Malignant neoplasm of upper-inner quadrant of left female breast: Secondary | ICD-10-CM | POA: Diagnosis not present

## 2017-11-01 ENCOUNTER — Ambulatory Visit
Admission: RE | Admit: 2017-11-01 | Discharge: 2017-11-01 | Disposition: A | Discharge status: Home / Self Care | Payer: POS | Source: Ambulatory Visit | Attending: Radiation Oncology | Admitting: Radiation Oncology

## 2017-11-01 DIAGNOSIS — C50212 Malignant neoplasm of upper-inner quadrant of left female breast: Secondary | ICD-10-CM | POA: Diagnosis not present

## 2017-11-02 ENCOUNTER — Ambulatory Visit
Admission: RE | Admit: 2017-11-02 | Discharge: 2017-11-02 | Disposition: A | Discharge status: Home / Self Care | Payer: POS | Source: Ambulatory Visit | Attending: Radiation Oncology | Admitting: Radiation Oncology

## 2017-11-02 DIAGNOSIS — C50212 Malignant neoplasm of upper-inner quadrant of left female breast: Secondary | ICD-10-CM | POA: Diagnosis not present

## 2017-11-05 ENCOUNTER — Encounter: Payer: Self-pay | Admitting: Rehabilitation

## 2017-11-05 ENCOUNTER — Ambulatory Visit
Admission: RE | Admit: 2017-11-05 | Discharge: 2017-11-05 | Disposition: A | Discharge status: Home / Self Care | Payer: POS | Source: Ambulatory Visit | Attending: Radiation Oncology | Admitting: Radiation Oncology

## 2017-11-05 ENCOUNTER — Ambulatory Visit: Payer: POS | Attending: General Surgery | Admitting: Rehabilitation

## 2017-11-05 DIAGNOSIS — Z170 Estrogen receptor positive status [ER+]: Secondary | ICD-10-CM | POA: Diagnosis present

## 2017-11-05 DIAGNOSIS — Z483 Aftercare following surgery for neoplasm: Secondary | ICD-10-CM | POA: Diagnosis present

## 2017-11-05 DIAGNOSIS — C50212 Malignant neoplasm of upper-inner quadrant of left female breast: Secondary | ICD-10-CM | POA: Diagnosis present

## 2017-11-05 DIAGNOSIS — M25612 Stiffness of left shoulder, not elsewhere classified: Secondary | ICD-10-CM | POA: Insufficient documentation

## 2017-11-05 DIAGNOSIS — Z9189 Other specified personal risk factors, not elsewhere classified: Secondary | ICD-10-CM | POA: Insufficient documentation

## 2017-11-05 NOTE — Therapy (Signed)
[place], [place], [address] Phone: [phone]   Fax:  [phone]  Physical Therapy Treatment  Patient Details  Name: Gloria Kramer MRN: [id number] Date of Birth: [birth year] Referring Provider: Dr. Marlou Starks   Encounter Date: 11/05/2017  PT End of Session - 11/05/17 0924    Visit Number  2    Date for PT Re-Evaluation  11/26/17    PT Start Time  0845    PT Stop Time  0925    PT Time Calculation (min)  40 min    Activity Tolerance  Patient tolerated treatment well    Behavior During Therapy  [facility] for tasks assessed/performed       Past Medical History:  Diagnosis Date  . Anemia    iron deficiency from heavy periods  . Cancer ([place]) 07/2017   left breast cancer  . Family history of brain cancer   . Family history of breast cancer   . Hypothyroidism    Hoshimoto's thyroiditis  . Thyroid disease     Past Surgical History:  Procedure Laterality Date  . BREAST LUMPECTOMY WITH AXILLARY LYMPH NODE BIOPSY Left 08/27/2017   Procedure: LEFT BREAST LUMPECTOMY WITH AXILLARY LYMPH NODE BIOPSY;  Surgeon: Jovita Kussmaul, MD;  Location: [facility];  Service: General;  Laterality: Left;  . WISDOM TOOTH EXTRACTION      There were no vitals filed for this visit.  Subjective Assessment - 11/05/17 0844    Subjective  About half way through the radiation.  It's just annoying.  I use aloe with essential oils on the skin. I feel like the ROM is about the same.  Maybe some swelling.  I can't get in to the pool right now.  The Lt side thoracic region is now feeling a bit sore and swollen.      Patient Stated Goals  improve ROM     Currently in Pain?  No/denies   intermittent shooting and stabbing in the breast        [facility] PT Assessment - 11/05/17 0001      Observation/Other Assessments   Observations  no swelling       AROM   Right Shoulder Flexion  150 Degrees    Right Shoulder ABduction  170  Degrees    Left Shoulder Flexion  155 Degrees    Left Shoulder ABduction  170 Degrees        LYMPHEDEMA/ONCOLOGY QUESTIONNAIRE - 11/05/17 0902      Right Upper Extremity Lymphedema   10 cm Proximal to Olecranon Process  24 cm    Olecranon Process  23 cm    10 cm Proximal to Ulnar Styloid Process  17.8 cm    Just Proximal to Ulnar Styloid Process  15 cm    Across Hand at [employer]  19 cm    At [place] of 2nd Digit  5.8 cm                [facility] Adult PT Treatment/Exercise - 11/05/17 0001      Exercises   Exercises  Other Exercises    Other Exercises   gave pt strength ABC handout with verbal instruction for all.  Discussion return to pool after radiation, using compression as needed, how to get compression garment at a special place, and a more compression camisole if feeling a bit swollen             PT Education - 11/05/17 7939  Education Details  gave pt strength ABC handout with verbal instruction for all.  Discussion return to pool after radiation, using compression as needed, how to get compression garment at a special place, and a more compression camisole if feeling a bit swollen    Person(s) Educated  Patient    Methods  Explanation;Demonstration;Verbal cues;Handout    Comprehension  Verbalized understanding          PT Long Term Goals - 11/05/17 0926      PT LONG TERM GOAL #1   Title  Pt will improve Lt shoulder active abduction to 170 to equal the opposite arm    Status  Achieved      PT LONG TERM GOAL #2   Title  Pt will be knowledgeable about lymphedema precautions and lifestyle modifications to prevent onset    Status  Achieved      PT LONG TERM GOAL #3   Title  Pt will understand where to obtain appopriate compression garments    Status  Achieved            Plan - 11/05/17 0925    Clinical Impression Statement  Gloria Kramer returns today for follow up with excellent status.  Her ROM of the Rt shoulder has returned, the  circumferential measurements are the same or less as post-op, her swelling feeling in the axilla appears normal response to radiation.  She was educated on strength ABC program, how to get her compression, and when to use it.  Pt should not need any more follow up unless needed.      PT Home Exercise Plan  given post op exercises 8/5, strength ABC 11/05/17       Patient will benefit from skilled therapeutic intervention in order to improve the following deficits and impairments:  Decreased knowledge of use of DME, Increased fascial restricitons, Decreased mobility, Decreased activity tolerance, Decreased range of motion, Decreased knowledge of precautions  Visit Diagnosis: Malignant neoplasm of upper-inner quadrant of left breast in female, estrogen receptor positive [facility])  Aftercare following surgery for neoplasm  Stiffness of left shoulder, not elsewhere classified  At risk for lymphedema     Problem List Patient Active Problem List   Diagnosis Date Noted  . Genetic testing 07/30/2017  . Family history of breast cancer   . Family history of brain cancer   . Malignant neoplasm of upper-inner quadrant of left breast in female, estrogen receptor positive ([place]) 07/18/2017  . Iron deficiency anemia 03/08/2017  . Hypothyroid 01/16/2011  . Perimenopause 01/16/2011    Shan Levans, PT 11/05/2017, 9:27 AM  [place], [place], [address] Phone: [phone]   Fax:  [phone]  Name: Gloria Kramer MRN: [id number] Date of Birth: 08-13-1965   PHYSICAL THERAPY DISCHARGE SUMMARY  Visits from Start of Care: 2  Current functional level related to goals / functional outcomes:all goals met.  See above   Remaining deficits: Needing to return to prior level of activity after radiation   Education / Equipment:  Plan: Patient agrees to discharge.  Patient goals were met. Patient is being discharged due to  meeting the stated rehab goals.  ?????     Shan Levans, PT

## 2017-11-05 NOTE — Patient Instructions (Signed)
gave pt strength ABC handout with verbal instruction for all.  Discussion return to pool after radiation, using compression as needed, how to get compression garment at a special place, and a more compression camisole if feeling a bit swollen

## 2017-11-06 ENCOUNTER — Ambulatory Visit
Admission: RE | Admit: 2017-11-06 | Discharge: 2017-11-06 | Disposition: A | Discharge status: Home / Self Care | Payer: POS | Source: Ambulatory Visit | Attending: Radiation Oncology | Admitting: Radiation Oncology

## 2017-11-06 DIAGNOSIS — C50212 Malignant neoplasm of upper-inner quadrant of left female breast: Secondary | ICD-10-CM | POA: Diagnosis not present

## 2017-11-07 ENCOUNTER — Ambulatory Visit
Admission: RE | Admit: 2017-11-07 | Discharge: 2017-11-07 | Disposition: A | Discharge status: Home / Self Care | Payer: POS | Source: Ambulatory Visit | Attending: Radiation Oncology | Admitting: Radiation Oncology

## 2017-11-07 DIAGNOSIS — C50212 Malignant neoplasm of upper-inner quadrant of left female breast: Secondary | ICD-10-CM | POA: Diagnosis not present

## 2017-11-08 ENCOUNTER — Telehealth: Payer: Self-pay

## 2017-11-08 ENCOUNTER — Inpatient Hospital Stay: Payer: POS | Attending: Oncology | Admitting: Oncology

## 2017-11-08 ENCOUNTER — Inpatient Hospital Stay: Payer: POS

## 2017-11-08 ENCOUNTER — Ambulatory Visit
Admission: RE | Admit: 2017-11-08 | Discharge: 2017-11-08 | Disposition: A | Discharge status: Home / Self Care | Payer: POS | Source: Ambulatory Visit | Attending: Radiation Oncology | Admitting: Radiation Oncology

## 2017-11-08 VITALS — BP 117/68 | HR 66 | Temp 98.1°F | Resp 17 | Ht 68.0 in | Wt 123.3 lb

## 2017-11-08 DIAGNOSIS — C50212 Malignant neoplasm of upper-inner quadrant of left female breast: Secondary | ICD-10-CM | POA: Diagnosis present

## 2017-11-08 DIAGNOSIS — D259 Leiomyoma of uterus, unspecified: Secondary | ICD-10-CM | POA: Insufficient documentation

## 2017-11-08 DIAGNOSIS — C773 Secondary and unspecified malignant neoplasm of axilla and upper limb lymph nodes: Secondary | ICD-10-CM | POA: Diagnosis not present

## 2017-11-08 DIAGNOSIS — N920 Excessive and frequent menstruation with regular cycle: Secondary | ICD-10-CM

## 2017-11-08 DIAGNOSIS — Z170 Estrogen receptor positive status [ER+]: Secondary | ICD-10-CM

## 2017-11-08 DIAGNOSIS — D509 Iron deficiency anemia, unspecified: Secondary | ICD-10-CM

## 2017-11-08 LAB — CBC WITH DIFFERENTIAL (CANCER CENTER ONLY)
BASOS ABS: 0 10*3/uL (ref 0.0–0.1)
BASOS PCT: 0 %
EOS ABS: 0.1 10*3/uL (ref 0.0–0.5)
EOS PCT: 1 %
HCT: 32.7 % — ABNORMAL LOW (ref 34.8–46.6)
Hemoglobin: 10.4 g/dL — ABNORMAL LOW (ref 11.6–15.9)
LYMPHS PCT: 10 %
Lymphs Abs: 0.4 10*3/uL — ABNORMAL LOW (ref 0.9–3.3)
MCH: 27 pg (ref 25.1–34.0)
MCHC: 31.8 g/dL (ref 31.5–36.0)
MCV: 84.9 fL (ref 79.5–101.0)
Monocytes Absolute: 0.4 10*3/uL (ref 0.1–0.9)
Monocytes Relative: 10 %
Neutro Abs: 3.4 10*3/uL (ref 1.5–6.5)
Neutrophils Relative %: 79 %
PLATELETS: 194 10*3/uL (ref 145–400)
RBC: 3.85 MIL/uL (ref 3.70–5.45)
RDW: 13.3 % (ref 11.2–14.5)
WBC: 4.3 10*3/uL (ref 3.9–10.3)

## 2017-11-08 LAB — IRON AND TIBC
Iron: 24 ug/dL — ABNORMAL LOW (ref 41–142)
Saturation Ratios: 5 % — ABNORMAL LOW (ref 21–57)
TIBC: 439 ug/dL (ref 236–444)
UIBC: 415 ug/dL

## 2017-11-08 LAB — FERRITIN

## 2017-11-08 NOTE — Progress Notes (Signed)
Hematology and Oncology Follow Up Visit  NASHA DISS [id number] 01/03/1966 52 y.o. 11/08/2017 10:15 AM Shawnee Knapp, MDToth, Sena Hitch, MD   Principle Diagnosis: 52 year old woman with:  1. Iron deficiency anemia diagnosed in August 2018.  She presented with heavy menstrual blood losses and a hemoglobin of 7.2 and ferritin of 5 in January 2019.    2.  Left sided breast cancer and status post left lumpectomy.  Pathological staging is T2N17m with micrometastatic disease noted in 1 of the lymph nodes noted in July 2019.   Prior Therapy:  IV iron form of Feraheme completed in January 2019.  Current therapy:   Undergone breast and regional lymph node radiation currently ongoing.  Under consideration for repeat intravenous iron.    Interim History: Ms. HBarreirais here for a follow-up visit.  Since her last visit, she was diagnosed with left-sided breast cancer and underwent a lumpectomy completed by Dr. TMarlou Starkson August 27, 2017.  Her tumor was found to be T2 with micrometastatic disease in 1 of the sentinel lymph node sampling.  Her tumor is ER positive HER-2 negative with a recurrence score of 17.  She tolerated surgery and radiation reasonably well.  She is experiencing more fatigue and tiredness however.  She denies any chest pain or dyspnea on exertion.  Her performance status remains excellent and her quality of life is unchanged.  She continues to have heavy menstrual cycles however.  She does not report any headaches, blurry vision, syncope or seizures.  She denies any alteration mental status or confusion.  Does not report any fevers, chills or sweats.  Does not report any cough, wheezing or hemoptysis.  Does not report any chest pain, palpitation, orthopnea or leg edema.  Does not report any nausea, vomiting or abdominal pain.  Does not report any hematochezia or melena.  Does not report any arthralgias or myalgias.   Does not report frequency, urgency or hematuria.  Does not report any  skin rashes or lesions. Does not report any lymphadenopathy or petechiae.  Remaining review of systems is negative.    Medications: I have reviewed the patient's current medications.  Current Outpatient Medications  Medication Sig Dispense Refill  . b complex vitamins capsule Take 1 capsule by mouth daily.    .Marland KitchenHYDROcodone-acetaminophen (NORCO/VICODIN) 5-325 MG tablet Take 1-2 tablets by mouth every 6 (six) hours as needed for moderate pain or severe pain. 15 tablet 0  . levothyroxine (SYNTHROID, LEVOTHROID) 112 MCG tablet Take 1 tablet (112 mcg total) by mouth daily before breakfast. 90 tablet 1  . Polysaccharide Iron Complex (NOVAFERRUM 50) 50 MG CAPS Take 50 mg by mouth daily. 90 capsule    No current facility-administered medications for this visit.      Allergies:  Allergies  Allergen Reactions  . Penicillins Rash    Unsure reaction was when she was a child    Past Medical History, Surgical history, Social history, and Family History were reviewed and updated.    Physical Exam:  Blood pressure 117/68, pulse 66, temperature 98.1 F (36.7 C), temperature source Oral, resp. rate 17, height '5\' 8"'$  (1.727 m), weight 123 lb 4.8 oz (55.9 kg), SpO2 100 %.    ECOG: 0   General appearance: Comfortable appearing without any discomfort Head: Normocephalic without any trauma Oropharynx: Mucous membranes are moist and pink without any thrush or ulcers. Eyes: Pupils are equal and round reactive to light. Lymph nodes: No cervical, supraclavicular, inguinal or axillary lymphadenopathy.   Heart:regular  rate and rhythm.  S1 and S2 without leg edema. Lung: Clear without any rhonchi or wheezes.  No dullness to percussion. Abdomin: Soft, nontender, nondistended with good bowel sounds.  No hepatosplenomegaly. Musculoskeletal: No joint deformity or effusion.  Full range of motion noted. Neurological: No deficits noted on motor, sensory and deep tendon reflex exam. Skin: No petechial rash or  dryness.  Appeared moist.       Lab Results: Lab Results  Component Value Date   WBC 3.9 07/10/2017   HGB 13.1 07/10/2017   HCT 38.8 07/10/2017   MCV 89.5 07/10/2017   PLT 222 07/10/2017     Chemistry      Component Value Date/Time   NA 140 01/25/2017   K 4.6 01/25/2017   CL 104 07/21/2014 1323   CO2 21 07/21/2014 1323   BUN 12 01/25/2017   CREATININE 0.6 01/25/2017   CREATININE 0.74 07/21/2014 1323   GLU 102 01/25/2017      Component Value Date/Time   CALCIUM 9.5 07/21/2014 1323   ALKPHOS 55 07/21/2014 1323   AST 17 07/21/2014 1323   ALT 12 07/21/2014 1323   BILITOT 1.3 (H) 07/21/2014 1323       Impression and Plan:  52 year old woman with:   1.  Iron deficiency related to chronic menstrual blood losses diagnosed January 2019.   She received intravenous iron back in January 2019 with excellent improvement in her hemoglobin and iron studies.  Her hemoglobin has drifted down and iron studies are currently pending.  She feels that she might require repeat intravenous iron at this time.  Risks and benefits of repeat Feraheme infusion was reviewed today and she is agreeable to receive this in the near future.  He will receive Feraheme of total of 1000 mg in split doses.   2.  Menorrhagia: Related to uterine fibroids.  If this continues to be an issue, consideration for hysterectomy and oophorectomy especially in the setting of her breast cancer may be reasonable.  3.  Colon cancer screening: I recommended that she undergo a colonoscopy as well next year as she is approaching the age of 55.  33.  Left-sided breast cancer: Her tumor is T2N1 with microscopic involvement of 1 of the sentinel lymph nodes.  She status post lumpectomy and currently undergoing radiation.  Adjuvant hormonal therapy will be managed by Dr. Lindi Adie  5.  Follow-up: I have recommended that she follows with Dr. Lindi Adie only to consolidate her care.  He will monitor her iron moving forward and  treated with intravenous iron if needed to in the future.  She is agreeable with this plan at this time.  15  minutes was spent with the patient face-to-face today.  More than 50% of time was dedicated to reviewing laboratory data, discussing the natural course of her disease and coordinating her plan of care.     Zola Button, MD 9/12/201910:15 AM

## 2017-11-08 NOTE — Telephone Encounter (Signed)
Printed avs and calender of upcoming appointments for iron. Per 9/12 los No MD follow up is needed for now.

## 2017-11-09 ENCOUNTER — Ambulatory Visit
Admission: RE | Admit: 2017-11-09 | Discharge: 2017-11-09 | Disposition: A | Discharge status: Home / Self Care | Payer: POS | Source: Ambulatory Visit | Attending: Radiation Oncology | Admitting: Radiation Oncology

## 2017-11-09 DIAGNOSIS — C50212 Malignant neoplasm of upper-inner quadrant of left female breast: Secondary | ICD-10-CM | POA: Diagnosis not present

## 2017-11-12 ENCOUNTER — Ambulatory Visit
Admission: RE | Admit: 2017-11-12 | Discharge: 2017-11-12 | Disposition: A | Discharge status: Home / Self Care | Payer: POS | Source: Ambulatory Visit | Attending: Radiation Oncology | Admitting: Radiation Oncology

## 2017-11-12 DIAGNOSIS — C50212 Malignant neoplasm of upper-inner quadrant of left female breast: Secondary | ICD-10-CM | POA: Diagnosis not present

## 2017-11-13 ENCOUNTER — Ambulatory Visit
Admission: RE | Admit: 2017-11-13 | Discharge: 2017-11-13 | Disposition: A | Discharge status: Home / Self Care | Payer: POS | Source: Ambulatory Visit | Attending: Radiation Oncology | Admitting: Radiation Oncology

## 2017-11-13 DIAGNOSIS — C50212 Malignant neoplasm of upper-inner quadrant of left female breast: Secondary | ICD-10-CM | POA: Diagnosis not present

## 2017-11-14 ENCOUNTER — Ambulatory Visit
Admission: RE | Admit: 2017-11-14 | Discharge: 2017-11-14 | Disposition: A | Discharge status: Home / Self Care | Payer: POS | Source: Ambulatory Visit | Attending: Radiation Oncology | Admitting: Radiation Oncology

## 2017-11-14 ENCOUNTER — Ambulatory Visit (HOSPITAL_COMMUNITY)
Admission: RE | Admit: 2017-11-14 | Discharge: 2017-11-14 | Disposition: A | Discharge status: Home / Self Care | Payer: POS | Source: Ambulatory Visit | Attending: Oncology | Admitting: Oncology

## 2017-11-14 DIAGNOSIS — C50212 Malignant neoplasm of upper-inner quadrant of left female breast: Secondary | ICD-10-CM | POA: Diagnosis not present

## 2017-11-14 DIAGNOSIS — D509 Iron deficiency anemia, unspecified: Secondary | ICD-10-CM | POA: Diagnosis not present

## 2017-11-14 MED ORDER — SODIUM CHLORIDE 0.9 % IV SOLN
510.0000 mg | Freq: Once | INTRAVENOUS | Status: AC
  Administered 2017-11-14: 510 mg via INTRAVENOUS
  Filled 2017-11-14: qty 17

## 2017-11-14 MED ORDER — SODIUM CHLORIDE 0.9 % IV SOLN
INTRAVENOUS | Status: DC
  Administered 2017-11-14: 09:00:00 via INTRAVENOUS

## 2017-11-14 NOTE — Discharge Instructions (Signed)

## 2017-11-14 NOTE — Progress Notes (Signed)
Pt received IV infusion of feraheme per MD order; pt tolerated well; no complications noted  Ordering Provider: Dr. Alen Blew Associated Diagnosis: Iron deficiency anemia [D50.9]

## 2017-11-15 ENCOUNTER — Ambulatory Visit
Admission: RE | Admit: 2017-11-15 | Discharge: 2017-11-15 | Disposition: A | Discharge status: Home / Self Care | Payer: POS | Source: Ambulatory Visit | Attending: Radiation Oncology | Admitting: Radiation Oncology

## 2017-11-15 DIAGNOSIS — C50212 Malignant neoplasm of upper-inner quadrant of left female breast: Secondary | ICD-10-CM | POA: Diagnosis not present

## 2017-11-16 ENCOUNTER — Ambulatory Visit
Admission: RE | Admit: 2017-11-16 | Discharge: 2017-11-16 | Disposition: A | Discharge status: Home / Self Care | Payer: POS | Source: Ambulatory Visit | Attending: Radiation Oncology | Admitting: Radiation Oncology

## 2017-11-16 DIAGNOSIS — C50212 Malignant neoplasm of upper-inner quadrant of left female breast: Secondary | ICD-10-CM | POA: Diagnosis not present

## 2017-11-19 ENCOUNTER — Inpatient Hospital Stay (HOSPITAL_BASED_OUTPATIENT_CLINIC_OR_DEPARTMENT_OTHER): Payer: POS | Admitting: Hematology and Oncology

## 2017-11-19 ENCOUNTER — Telehealth: Payer: Self-pay | Admitting: Hematology and Oncology

## 2017-11-19 ENCOUNTER — Ambulatory Visit
Admission: RE | Admit: 2017-11-19 | Discharge: 2017-11-19 | Disposition: A | Discharge status: Home / Self Care | Payer: POS | Source: Ambulatory Visit | Attending: Radiation Oncology | Admitting: Radiation Oncology

## 2017-11-19 DIAGNOSIS — Z170 Estrogen receptor positive status [ER+]: Secondary | ICD-10-CM | POA: Diagnosis not present

## 2017-11-19 DIAGNOSIS — C50212 Malignant neoplasm of upper-inner quadrant of left female breast: Secondary | ICD-10-CM

## 2017-11-19 DIAGNOSIS — C773 Secondary and unspecified malignant neoplasm of axilla and upper limb lymph nodes: Secondary | ICD-10-CM | POA: Diagnosis not present

## 2017-11-19 MED ORDER — TAMOXIFEN CITRATE 20 MG PO TABS: 20.0000 mg | ORAL_TABLET | Freq: Every day | ORAL | 3 refills | Status: DC

## 2017-11-19 NOTE — Progress Notes (Signed)
Patient Care Team: Shawnee Knapp, MD as PCP - General (Family Medicine) Nicholas Lose, MD as Consulting Physician (Hematology and Oncology) Louretta Shorten, MD as Consulting Physician (Obstetrics and Gynecology)  DIAGNOSIS:  Encounter Diagnosis  Name Primary?  . Malignant neoplasm of upper-inner quadrant of left breast in female, estrogen receptor positive ([place])     SUMMARY OF ONCOLOGIC HISTORY:   Malignant neoplasm of upper-inner quadrant of left breast in female, estrogen receptor positive ([place])   07/09/2017 Initial Diagnosis    Palpable mass left breast 3 cm from the nipple measuring 1.6 x 1.8 x 2.5 cm, biopsy revealed grade 2 IDC ER 95%, PR 90%, Ki-67 15%, HER-2 negative ratio 1.26, T2 N0 stage Ib clinical stage AJCC 8    07/27/2017 Genetic Testing    CHEK2 p.M381V VUS identified on the CancerNext panel.  The CancerNext gene panel offered by [employer] includes sequencing and rearrangement analysis for the following 34 genes:   APC, ATM, BARD1, BMPR1A, BRCA1, BRCA2, BRIP1, CDH1, CDK4, CDKN2A, CHEK2, DICER1, HOXB13, EPCAM, GREM1, MLH1, MRE11A, MSH2, MSH6, MUTYH, NBN, NF1, PALB2, PMS2, POLD1, POLE, PTEN, RAD50, RAD51C, RAD51D, SMAD4, SMARCA4, STK11, and TP53.  The report date is Jul 27, 2017.    08/27/2017 Surgery    Left lumpectomy: IDC grade 2, 2.2 cm, low-grade DCIS, lymphovascular invasion identified, margins negative, 1 lymph node with micrometastatic disease, 3 additional lymph nodes negative, ER 95%, PR 90%, Ki-67 15%, HER-2 negative ratio 1.26, T2N1 mic stage Ib    09/03/2017 Cancer Staging    Staging form: Breast, AJCC 8th Edition - Pathologic: Stage IB (pT2, pN11m, cM0, G2, ER+, PR+, HER2-) - Signed by GNicholas Lose MD on 09/03/2017    10/15/2017 - 11/19/2017 Radiation Therapy    Adjuvant XRT     CHIEF COMPLIANT: Follow-up after adjuvant radiation therapy  INTERVAL HISTORY: Gloria PITZis a 89-yearwith above-mentioned history of left breast cancer is finishing her adjuvant  radiation therapy.  Will finish her radiation and will a week.  She is here today to discuss follow-up with antiestrogen therapy with tamoxifen.  She cannot wait to be done with radiation.  She has mild radiation dermatitis.  REVIEW OF SYSTEMS:   Constitutional: Denies fevers, chills or abnormal weight loss Eyes: Denies blurriness of vision Ears, nose, mouth, throat, and face: Denies mucositis or sore throat Respiratory: Denies cough, dyspnea or wheezes Cardiovascular: Denies palpitation, chest discomfort Gastrointestinal:  Denies nausea, heartburn or change in bowel habits Skin: Denies abnormal skin rashes Lymphatics: Denies new lymphadenopathy or easy bruising Neurological:Denies numbness, tingling or new weaknesses Behavioral/Psych: Mood is stable, no new changes  Extremities: No lower extremity edema Breas left radiation dermatitis All other systems were reviewed with the patient and are negative.  I have reviewed the past medical history, past surgical history, social history and family history with the patient and they are unchanged from previous note.  ALLERGIES:  is allergic to penicillins.  MEDICATIONS:  Current Outpatient Medications  Medication Sig Dispense Refill  . b complex vitamins capsule Take 1 capsule by mouth daily.    .Marland Kitchenlevothyroxine (SYNTHROID, LEVOTHROID) 112 MCG tablet Take 1 tablet (112 mcg total) by mouth daily before breakfast. 90 tablet 1  . Polysaccharide Iron Complex (NOVAFERRUM 50) 50 MG CAPS Take 50 mg by mouth daily. 90 capsule   . [START ON 12/17/2017] tamoxifen (NOLVADEX) 20 MG tablet Take 1 tablet (20 mg total) by mouth daily. 90 tablet 3   No current facility-administered medications for this visit.  PHYSICAL EXAMINATION: ECOG PERFORMANCE STATUS: 1 - Symptomatic but completely ambulatory  Vitals:   11/19/17 1404  BP: 113/62  Pulse: 85  Resp: 18  Temp: 97.6 F (36.4 C)  SpO2: 100%   Filed Weights   11/19/17 1404  Weight: 121 lb 12.8  oz (55.2 kg)    GENERAL:alert, no distress and comfortable SKIN: skin color, texture, turgor are normal, no rashes or significant lesions EYES: normal, Conjunctiva are pink and non-injected, sclera clear OROPHARYNX:no exudate, no erythema and lips, buccal mucosa, and tongue normal  NECK: supple, thyroid normal size, non-tender, without nodularity LYMPH:  no palpable lymphadenopathy in the cervical, axillary or inguinal LUNGS: clear to auscultation and percussion with normal breathing effort HEART: regular rate & rhythm and no murmurs and no lower extremity edema ABDOMEN:abdomen soft, non-tender and normal bowel sounds MUSCULOSKELETAL:no cyanosis of digits and no clubbing  NEURO: alert & oriented x 3 with fluent speech, no focal motor/sensory deficits EXTREMITIES: No lower extremity edema   LABORATORY DATA:  I have reviewed the data as listed CMP Latest Ref Rng & Units 01/25/2017 07/21/2014  Glucose 70 - 99 mg/dL - 98  BUN 4 - _0 Creatinine 0.5 - 1.1 0.6 0.74  Sodium 137 - 147 140 138  Potassium 3.4 - 5.3 4.6 4.1  Chloride 96 - 112 mEq/L - 104  CO2 19 - 32 mEq/L - 21  Calcium 8.4 - 10.5 mg/dL - 9.5  Total Protein 6.0 - 8.3 g/dL - 7.2  Total Bilirubin 0.2 - 1.2 mg/dL - 1.3(H)  Alkaline Phos 39 - 117 U/L - 55  AST 0 - 37 U/L - 17  ALT 0 - 35 U/L - 12    Lab Results  Component Value Date   WBC 4.3 11/08/2017   HGB 10.4 (L) 11/08/2017   HCT 32.7 (L) 11/08/2017   MCV 84.9 11/08/2017   PLT 194 11/08/2017   NEUTROABS 3.4 11/08/2017   ASSESSMENT & PLAN:  Malignant neoplasm of upper-inner quadrant of left breast in female, estrogen receptor positive ([place]) 08/27/2017:Left lumpectomy: IDC grade 2, 2.2 cm, low-grade DCIS, lymphovascular invasion identified, margins negative, 1 lymph node with micrometastatic disease, 3 additional lymph nodes negative, ER 95%, PR 90%, Ki-67 15%, HER-2 negative ratio 1.26, T2N1 mic stage Ib Oncotype DX recurrence score 17: Risk of distant  recurrence 5% Adjuvant radiation therapy 10/13/2017 to 11/19/2017  Treatment plan: Oral antiestrogen therapy with tamoxifen 20 mg daily x10 years  We discussed the risks and benefits of tamoxifen. These include but not limited to insomnia, hot flashes, mood changes, vaginal dryness, and weight gain. Although rare, serious side effects including endometrial cancer, risk of blood clots were also discussed. We strongly believe that the benefits far outweigh the risks. Patient understands these risks and consented to starting treatment. Planned treatment duration is 10 years.  Return to clinic in 3 months for survivorship care plan visit    Orders Placed This Encounter  Procedures  . Ferritin    Standing Status:   Future    Standing Expiration Date:   11/19/2018  . Iron and TIBC    Standing Status:   Future    Standing Expiration Date:   11/19/2018  . CBC with Differential (Cancer Center Only)    Standing Status:   Future    Standing Expiration Date:   11/20/2018   The patient has a good understanding of the overall plan. she agrees with it. she will call with any problems that may develop before  the next visit here.   Harriette Ohara, MD 11/19/17

## 2017-11-19 NOTE — Telephone Encounter (Signed)
Pt declined avs and calendar  °

## 2017-11-19 NOTE — Assessment & Plan Note (Signed)
08/27/2017:Left lumpectomy: IDC grade 2, 2.2 cm, low-grade DCIS, lymphovascular invasion identified, margins negative, 1 lymph node with micrometastatic disease, 3 additional lymph nodes negative, ER 95%, PR 90%, Ki-67 15%, HER-2 negative ratio 1.26, T2N1 mic stage Ib Oncotype DX recurrence score 17: Risk of distant recurrence 5% Adjuvant radiation therapy 10/13/2017 to 11/19/2017  Treatment plan: Oral antiestrogen therapy with tamoxifen 20 mg daily x10 years  We discussed the risks and benefits of tamoxifen. These include but not limited to insomnia, hot flashes, mood changes, vaginal dryness, and weight gain. Although rare, serious side effects including endometrial cancer, risk of blood clots were also discussed. We strongly believe that the benefits far outweigh the risks. Patient understands these risks and consented to starting treatment. Planned treatment duration is 10 years.  Return to clinic in 3 months for survivorship care plan visit

## 2017-11-20 ENCOUNTER — Ambulatory Visit
Admission: RE | Admit: 2017-11-20 | Discharge: 2017-11-20 | Disposition: A | Discharge status: Home / Self Care | Payer: POS | Source: Ambulatory Visit | Attending: Radiation Oncology | Admitting: Radiation Oncology

## 2017-11-20 DIAGNOSIS — C50212 Malignant neoplasm of upper-inner quadrant of left female breast: Secondary | ICD-10-CM | POA: Diagnosis not present

## 2017-11-21 ENCOUNTER — Ambulatory Visit (HOSPITAL_COMMUNITY)
Admission: RE | Admit: 2017-11-21 | Discharge: 2017-11-21 | Disposition: A | Discharge status: Home / Self Care | Payer: POS | Source: Ambulatory Visit | Attending: Oncology | Admitting: Oncology

## 2017-11-21 ENCOUNTER — Ambulatory Visit
Admission: RE | Admit: 2017-11-21 | Discharge: 2017-11-21 | Disposition: A | Discharge status: Home / Self Care | Payer: POS | Source: Ambulatory Visit | Attending: Radiation Oncology | Admitting: Radiation Oncology

## 2017-11-21 DIAGNOSIS — C50212 Malignant neoplasm of upper-inner quadrant of left female breast: Secondary | ICD-10-CM | POA: Diagnosis not present

## 2017-11-21 DIAGNOSIS — D509 Iron deficiency anemia, unspecified: Secondary | ICD-10-CM | POA: Diagnosis not present

## 2017-11-21 MED ORDER — SODIUM CHLORIDE 0.9 % IV SOLN
510.0000 mg | Freq: Once | INTRAVENOUS | Status: AC
  Administered 2017-11-21: 510 mg via INTRAVENOUS
  Filled 2017-11-21: qty 17

## 2017-11-21 MED ORDER — SODIUM CHLORIDE 0.9 % IV SOLN
INTRAVENOUS | Status: DC
  Administered 2017-11-21: 09:00:00 via INTRAVENOUS

## 2017-11-21 NOTE — Discharge Instructions (Signed)

## 2017-11-21 NOTE — Progress Notes (Signed)
Pt received IV infusion of feraheme per MD order; pt tolerated well; no complications noted  Ordering Provider: Dr. Alen Blew Associated Diagnosis: Iron deficiency anemia D50.9

## 2017-11-22 ENCOUNTER — Ambulatory Visit
Admission: RE | Admit: 2017-11-22 | Discharge: 2017-11-22 | Disposition: A | Discharge status: Home / Self Care | Payer: POS | Source: Ambulatory Visit | Attending: Radiation Oncology | Admitting: Radiation Oncology

## 2017-11-22 DIAGNOSIS — C50212 Malignant neoplasm of upper-inner quadrant of left female breast: Secondary | ICD-10-CM | POA: Diagnosis not present

## 2017-11-23 ENCOUNTER — Ambulatory Visit
Admission: RE | Admit: 2017-11-23 | Discharge: 2017-11-23 | Disposition: A | Discharge status: Home / Self Care | Payer: POS | Source: Ambulatory Visit | Attending: Radiation Oncology | Admitting: Radiation Oncology

## 2017-11-23 DIAGNOSIS — C50212 Malignant neoplasm of upper-inner quadrant of left female breast: Secondary | ICD-10-CM | POA: Diagnosis not present

## 2017-11-26 ENCOUNTER — Ambulatory Visit
Admission: RE | Admit: 2017-11-26 | Discharge: 2017-11-26 | Disposition: A | Discharge status: Home / Self Care | Payer: POS | Source: Ambulatory Visit | Attending: Radiation Oncology | Admitting: Radiation Oncology

## 2017-11-26 DIAGNOSIS — C50212 Malignant neoplasm of upper-inner quadrant of left female breast: Secondary | ICD-10-CM | POA: Diagnosis not present

## 2017-12-27 ENCOUNTER — Encounter: Payer: Self-pay | Admitting: Radiation Oncology

## 2017-12-27 NOTE — Progress Notes (Signed)
  Radiation Oncology         [phone]) [phone] ________________________________  Name: Gloria Kramer MRN: [id number]  Date: 12/27/2017  DOB: 10/27/1965  End of Treatment Note  Diagnosis:   left-sided breast cancer     Indication for treatment:  Curative       Radiation treatment dates:   10/10/2017 - 11/26/2017  Site/dose:   The patient initially received a dose of 50.4 Gy in 28 fractions to the left breast and supraclavicular region. This was delivered using a 3-D conformal, 4 field technique. The patient then received a boost to the breast. This delivered an additional 10 Gy in 5 fractions using an en face electron field. The total dose was 60.4 Gy.  Narrative: The patient tolerated radiation treatment relatively well.   The patient had some expected skin irritation as she progressed during treatment. Moist desquamation was not present at the end of treatment. She experienced mild erythema/hyperpigmentation.  Plan: The patient has completed radiation treatment. The patient will return to radiation oncology clinic for routine followup in one month. I advised the patient to call or return sooner if they have any questions or concerns related to their recovery or treatment. ________________________________  Jodelle Gross, M.D., Ph.D.

## 2017-12-31 ENCOUNTER — Other Ambulatory Visit: Payer: Self-pay | Admitting: General Surgery

## 2018-01-01 ENCOUNTER — Other Ambulatory Visit: Payer: Self-pay | Admitting: General Surgery

## 2018-01-01 DIAGNOSIS — N6310 Unspecified lump in the right breast, unspecified quadrant: Secondary | ICD-10-CM

## 2018-02-05 ENCOUNTER — Inpatient Hospital Stay: Payer: POS | Attending: Oncology

## 2018-02-05 DIAGNOSIS — Z170 Estrogen receptor positive status [ER+]: Secondary | ICD-10-CM | POA: Insufficient documentation

## 2018-02-05 DIAGNOSIS — D500 Iron deficiency anemia secondary to blood loss (chronic): Secondary | ICD-10-CM | POA: Diagnosis not present

## 2018-02-05 DIAGNOSIS — Z79899 Other long term (current) drug therapy: Secondary | ICD-10-CM | POA: Diagnosis not present

## 2018-02-05 DIAGNOSIS — N920 Excessive and frequent menstruation with regular cycle: Secondary | ICD-10-CM | POA: Insufficient documentation

## 2018-02-05 DIAGNOSIS — C50212 Malignant neoplasm of upper-inner quadrant of left female breast: Secondary | ICD-10-CM

## 2018-02-05 DIAGNOSIS — Z923 Personal history of irradiation: Secondary | ICD-10-CM | POA: Diagnosis not present

## 2018-02-05 DIAGNOSIS — Z79810 Long term (current) use of selective estrogen receptor modulators (SERMs): Secondary | ICD-10-CM | POA: Diagnosis not present

## 2018-02-05 LAB — CBC WITH DIFFERENTIAL (CANCER CENTER ONLY)
Abs Immature Granulocytes: 0.01 10*3/uL (ref 0.00–0.07)
BASOS ABS: 0 10*3/uL (ref 0.0–0.1)
Basophils Relative: 0 %
EOS ABS: 0.1 10*3/uL (ref 0.0–0.5)
EOS PCT: 1 %
HCT: 36.9 % (ref 36.0–46.0)
Hemoglobin: 12.4 g/dL (ref 12.0–15.0)
Immature Granulocytes: 0 %
Lymphocytes Relative: 13 %
Lymphs Abs: 0.6 10*3/uL — ABNORMAL LOW (ref 0.7–4.0)
MCH: 31.3 pg (ref 26.0–34.0)
MCHC: 33.6 g/dL (ref 30.0–36.0)
MCV: 93.2 fL (ref 80.0–100.0)
Monocytes Absolute: 0.3 10*3/uL (ref 0.1–1.0)
Monocytes Relative: 7 %
NRBC: 0 % (ref 0.0–0.2)
Neutro Abs: 3.7 10*3/uL (ref 1.7–7.7)
Neutrophils Relative %: 79 %
Platelet Count: 200 10*3/uL (ref 150–400)
RBC: 3.96 MIL/uL (ref 3.87–5.11)
RDW: 13.7 % (ref 11.5–15.5)
WBC: 4.7 10*3/uL (ref 4.0–10.5)

## 2018-02-05 LAB — IRON AND TIBC
IRON: 102 ug/dL (ref 41–142)
Saturation Ratios: 38 % (ref 21–57)
TIBC: 270 ug/dL (ref 236–444)
UIBC: 168 ug/dL (ref 120–384)

## 2018-02-05 LAB — FERRITIN: Ferritin: 57 ng/mL (ref 11–307)

## 2018-02-06 ENCOUNTER — Ambulatory Visit
Admission: RE | Admit: 2018-02-06 | Discharge: 2018-02-06 | Disposition: A | Discharge status: Home / Self Care | Payer: POS | Source: Ambulatory Visit | Attending: General Surgery | Admitting: General Surgery

## 2018-02-06 ENCOUNTER — Telehealth: Payer: Self-pay

## 2018-02-06 DIAGNOSIS — N6310 Unspecified lump in the right breast, unspecified quadrant: Secondary | ICD-10-CM

## 2018-02-06 MED ORDER — GADOBUTROL 1 MMOL/ML IV SOLN
6.0000 mL | Freq: Once | INTRAVENOUS | Status: AC | PRN
  Administered 2018-02-06: 6 mL via INTRAVENOUS

## 2018-02-06 NOTE — Telephone Encounter (Signed)
LVM for patient to remind of SCP visit with NP on 02/12/18 at 11 am.  Center number left on vm for callback with questions.

## 2018-02-12 ENCOUNTER — Encounter: Payer: Self-pay | Admitting: Adult Health

## 2018-02-12 ENCOUNTER — Telehealth: Payer: Self-pay | Admitting: Hematology and Oncology

## 2018-02-12 ENCOUNTER — Other Ambulatory Visit: Payer: POS

## 2018-02-12 ENCOUNTER — Inpatient Hospital Stay (HOSPITAL_BASED_OUTPATIENT_CLINIC_OR_DEPARTMENT_OTHER): Payer: POS | Admitting: Adult Health

## 2018-02-12 VITALS — BP 126/72 | HR 71 | Temp 97.5°F | Resp 18 | Ht 68.0 in | Wt 122.4 lb

## 2018-02-12 DIAGNOSIS — C50212 Malignant neoplasm of upper-inner quadrant of left female breast: Secondary | ICD-10-CM | POA: Diagnosis not present

## 2018-02-12 DIAGNOSIS — Z170 Estrogen receptor positive status [ER+]: Secondary | ICD-10-CM | POA: Diagnosis not present

## 2018-02-12 DIAGNOSIS — Z79899 Other long term (current) drug therapy: Secondary | ICD-10-CM

## 2018-02-12 DIAGNOSIS — Z923 Personal history of irradiation: Secondary | ICD-10-CM | POA: Diagnosis not present

## 2018-02-12 DIAGNOSIS — Z79810 Long term (current) use of selective estrogen receptor modulators (SERMs): Secondary | ICD-10-CM | POA: Diagnosis not present

## 2018-02-12 NOTE — Telephone Encounter (Signed)
Per patient she wanted to split appointments she decline avs and calendar

## 2018-02-12 NOTE — Progress Notes (Signed)
CLINIC:  Survivorship   REASON FOR VISIT:  Routine follow-up post-treatment for a recent history of breast cancer.  BRIEF ONCOLOGIC HISTORY:    Malignant neoplasm of upper-inner quadrant of left breast in female, estrogen receptor positive (Gloria Kramer)   07/09/2017 Initial Diagnosis    Palpable mass left breast 3 cm from the nipple measuring 1.6 x 1.8 x 2.5 cm, biopsy revealed grade 2 IDC ER 95%, PR 90%, Ki-67 15%, HER-2 negative ratio 1.26, T2 N0 stage Ib clinical stage AJCC 8    07/27/2017 Genetic Testing    CHEK2 p.M381V VUS identified on the CancerNext panel.  The CancerNext gene panel offered by [employer] includes sequencing and rearrangement analysis for the following 34 genes:   APC, ATM, BARD1, BMPR1A, BRCA1, BRCA2, BRIP1, CDH1, CDK4, CDKN2A, CHEK2, DICER1, HOXB13, EPCAM, GREM1, MLH1, MRE11A, MSH2, MSH6, MUTYH, NBN, NF1, PALB2, PMS2, POLD1, POLE, PTEN, RAD50, RAD51C, RAD51D, SMAD4, SMARCA4, STK11, and TP53.  The report date is Jul 27, 2017.    08/27/2017 Surgery    Left lumpectomy: IDC grade 2, 2.2 cm, low-grade DCIS, lymphovascular invasion identified, margins negative, 1 lymph node with micrometastatic disease, 3 additional lymph nodes negative, ER 95%, PR 90%, Ki-67 15%, HER-2 negative ratio 1.26, T2N1 mic stage Ib    08/27/2017 Oncotype testing    17/5%    09/03/2017 Cancer Staging    Staging form: Breast, AJCC 8th Edition - Pathologic: Stage IB (pT2, pN37m, cM0, G2, ER+, PR+, HER2-) - Signed by GNicholas Lose MD on 09/03/2017    10/15/2017 - 11/19/2017 Radiation Therapy    Adjuvant XRT    11/2017 -  Anti-estrogen oral therapy    Tamoxifen daily     INTERVAL HISTORY:  [facility] to review her survivorship care plan detailing her treatment course for breast cancer, as well as monitoring long-term side effects of that treatment, education regarding health maintenance, screening, and overall wellness and health  promotion.     Overall, Ms. HTimberlakereports feeling quite well.  She is taking Tamoxifen daily.  She notes that it is messing with her emotions.  She has been crying more than anything.  She is also more irritable than she used to be.  She has noted an improvement in her menstrual cycles since starting the Tamoxifen.  She used to have heavier periods, but now things are improved.    She does have h/o iron deficiency related to her anemia and heavy periods.  She last received iron a few months ago and her hemoglobin is improved.  She has noted some itnermittent acillary fullness.  She has seen PT and has undergone evaluation.  She has a script for the sleeve and     REVIEW OF SYSTEMS:  Review of Systems  Constitutional: Negative for appetite change, chills, fatigue, fever and unexpected weight change.  HENT:   Negative for hearing loss, lump/mass and trouble swallowing.   Eyes: Negative for eye problems and icterus.  Respiratory: Negative for chest tightness, cough and shortness of breath.   Cardiovascular: Negative for chest pain, leg swelling and palpitations.  Gastrointestinal: Negative for abdominal distention, abdominal pain, constipation, diarrhea, nausea and vomiting.  Endocrine: Negative for hot flashes.  Musculoskeletal: Negative for arthralgias.  Skin: Negative for itching and rash.  Neurological: Negative for dizziness, extremity weakness, headaches and numbness.  Hematological: Negative for adenopathy. Does not bruise/bleed easily.  Psychiatric/Behavioral: Negative for depression and suicidal ideas. The patient is nervous/anxious.  Breast: Denies any new nodularity, masses, tenderness, nipple changes, or nipple discharge.      ONCOLOGY TREATMENT TEAM:  1. Surgeon:  Dr. Marlou Starks at [facility] 2. Medical Oncologist: Dr. Lindi Adie  3. Radiation Oncologist: Dr. Lisbeth Renshaw    PAST MEDICAL/SURGICAL HISTORY:  Past Medical History:  Diagnosis Date  . Anemia    iron  deficiency from heavy periods  . Cancer ([place]) 07/2017   left breast cancer  . Family history of brain cancer   . Family history of breast cancer   . Hypothyroidism    Hoshimoto's thyroiditis  . Thyroid disease    Past Surgical History:  Procedure Laterality Date  . BREAST LUMPECTOMY WITH AXILLARY LYMPH NODE BIOPSY Left 08/27/2017   Procedure: LEFT BREAST LUMPECTOMY WITH AXILLARY LYMPH NODE BIOPSY;  Surgeon: Jovita Kussmaul, MD;  Location: [facility];  Service: General;  Laterality: Left;  . WISDOM TOOTH EXTRACTION       ALLERGIES:  Allergies  Allergen Reactions  . Penicillins Rash    Unsure reaction was when she was a child     CURRENT MEDICATIONS:  Outpatient Encounter Medications as of 02/12/2018  Medication Sig  . b complex vitamins capsule Take 1 capsule by mouth daily.  Marland Kitchen levothyroxine (SYNTHROID, LEVOTHROID) 112 MCG tablet Take 1 tablet (112 mcg total) by mouth daily before breakfast.  . Polysaccharide Iron Complex (NOVAFERRUM 50) 50 MG CAPS Take 50 mg by mouth daily.  . tamoxifen (NOLVADEX) 20 MG tablet Take 1 tablet (20 mg total) by mouth daily.   No facility-administered encounter medications on file as of 02/12/2018.      ONCOLOGIC FAMILY HISTORY:  Family History  Problem Relation Age of Onset  . Stroke Mother   . Breast cancer Mother   . Bladder Cancer Father   . Heart disease Maternal Grandmother        d. 104  . Brain cancer Maternal Grandfather        d. in his 32s  . Hypothyroidism Sister   . Dementia Paternal Aunt   . Breast cancer Cousin 63       mat first cousin  . Hashimoto's thyroiditis Cousin   . Lupus Cousin        mat first cousin  . Hashimoto's thyroiditis Son 12     GENETIC COUNSELING/TESTING: See above  SOCIAL HISTORY:  Social History   Socioeconomic History  . Marital status: Married    Spouse name: Not on file  . Number of children: 5  . Years of education: Not on file  . Highest education level: Not on file    Occupational History  . Not on file  Social Needs  . Financial resource strain: Not on file  . Food insecurity:    Worry: Not on file    Inability: Not on file  . Transportation needs:    Medical: Not on file    Non-medical: Not on file  Tobacco Use  . Smoking status: Never Smoker  . Smokeless tobacco: Never Used  Substance and Sexual Activity  . Alcohol use: No    Alcohol/week: 0.0 standard drinks  . Drug use: Never  . Sexual activity: Yes    Birth control/protection: None  Lifestyle  . Physical activity:    Days per week: Not on file    Minutes per session: Not on file  . Stress: Not on file  Relationships  . Social connections:    Talks on phone: Not on file  Gets together: Not on file    Attends religious service: Not on file    Active member of club or organization: Not on file    Attends meetings of clubs or organizations: Not on file    Relationship status: Not on file  . Intimate partner violence:    Fear of current or ex partner: Not on file    Emotionally abused: Not on file    Physically abused: Not on file    Forced sexual activity: Not on file  Other Topics Concern  . Not on file  Social History Narrative  . Not on file     PHYSICAL EXAMINATION:  Vital Signs:   Vitals:   02/12/18 1103  BP: 126/72  Pulse: 71  Resp: 18  Temp: (!) 97.5 F (36.4 C)  SpO2: 100%   Filed Weights   02/12/18 1103  Weight: 122 lb 6.4 oz (55.5 kg)   General: Well-nourished, well-appearing female in no acute distress.  She is unaccompanied today.   HEENT: Head is normocephalic.  Pupils equal and reactive to light. Conjunctivae clear without exudate.  Sclerae anicteric. Oral mucosa is pink, moist.  Oropharynx is pink without lesions or erythema.  Lymph: No cervical, supraclavicular, or infraclavicular lymphadenopathy noted on palpation.  Cardiovascular: Regular rate and rhythm.Marland Kitchen Respiratory: Clear to auscultation bilaterally. Chest expansion symmetric; breathing  non-labored.  Breasts: left breast s/p lumpectomy and radiation, no sign of local recurrence, right breast benign GI: Abdomen soft and round; non-tender, non-distended. Bowel sounds normoactive.  GU: Deferred.  Neuro: No focal deficits. Steady gait.  Psych: Mood and affect normal and appropriate for situation.  Extremities: No edema. MSK: No focal spinal tenderness to palpation.  Full range of motion in bilateral upper extremities Skin: Warm and dry.  LABORATORY DATA:  None for this visit.  DIAGNOSTIC IMAGING:  None for this visit.      ASSESSMENT AND PLAN:  Ms.. Kramer is a pleasant 52 y.o. female with Stage IB left breast invasive ductal carcinoma, ER+/PR+/HER2-, diagnosed in 06/2017, treated with lumpectomy, adjuvant radiation therapy, and anti-estrogen therapy with Tamoxifen beginning in 11/2017.  She presents to the Survivorship Clinic for our initial meeting and routine follow-up post-completion of treatment for breast cancer.    1. Stage IB left breast cancer:  Gloria Kramer is continuing to recover from definitive treatment for breast cancer. She will follow-up with her medical oncologist, Dr. Lindi Adie in 3 months with history and physical exam per surveillance protocol.  She will continue her anti-estrogen therapy with Tamoxifen. Thus far, she is tolerating the Tamoxifen well, with minimal side effects.  She does note some moodiness, but thinks it is more related to her stressful year and is working on improving self care. Today, a comprehensive survivorship care plan and treatment summary was reviewed with the patient today detailing her breast cancer diagnosis, treatment course, potential late/long-term effects of treatment, appropriate follow-up care with recommendations for the future, and patient education resources.  A copy of this summary, along with a letter will be sent to the patient's primary care provider via mail/fax/In Basket message after today's visit.    2. Menorrhagia and  iron deficiency anemia: She last received IV iron in 10/2017.  She was previously seen by Dr. Alen Blew, however Dr. Lindi Adie will take over following her iron levels.  I reviewed with Gloria Kramer that she could be a candidate for Zoladex, which can suppress her ovaries and stop her cycles.  We also talked about TAH/BSO.  She notes that  she will look into Zoladex.  She has already opted against surgery.  We reviewed her labs from last week and they were improved.  No need for iron at this point.  We will recheck her labs in 3 months prior to her f/u with Dr. Lindi Adie.  3. Bone health:  Given Gloria Kramer's age/history of breast cancer, she is at risk for bone demineralization. I counseled her that Tamoxifen will have a protective effect on her bones.  She was given education on specific activities to promote bone health.  4. Cancer screening:  Due to Gloria Kramer's history and her age, she should receive screening for skin cancers, colon cancer, and gynecologic cancers.  The information and recommendations are listed on the patient's comprehensive care plan/treatment summary and were reviewed in detail with the patient.    5. Health maintenance and wellness promotion: Gloria Kramer was encouraged to consume 5-7 servings of fruits and vegetables per day. We reviewed the "Nutrition Rainbow" handout, as well as the handout "Take Control of Your Health and Reduce Your Cancer Risk" from the [employer].  She was also encouraged to engage in moderate to vigorous exercise for 30 minutes per day most days of the week. We discussed the [employer] fitness program, which is designed for cancer survivors to help them become more physically fit after cancer treatments.  She was instructed to limit her alcohol consumption and continue to abstain from tobacco use.     6. Support services/counseling: It is not uncommon for this period of the patient's cancer care trajectory to be one of many emotions and stressors.  We  discussed an opportunity for her to participate in the next session of [facility] ("Finding Your New Normal") support group series designed for patients after they have completed treatment.   Gloria Kramer was encouraged to take advantage of our many other support services programs, support groups, and/or counseling in coping with her new life as a cancer survivor after completing anti-cancer treatment.  She was offered support today through active listening and expressive supportive counseling.  She was given information regarding our available services and encouraged to contact me with any questions or for help enrolling in any of our support group/programs.    Dispo:   -Return to cancer center in 3 months for f/u with Dr. Lindi Adie  -Mammogram due in 6/20 -She is welcome to return back to the Survivorship Clinic at any time; no additional follow-up needed at this time.  -Consider referral back to survivorship as a long-term survivor for continued surveillance  A total of (50) minutes of face-to-face time was spent with this patient with greater than 50% of that time in counseling and care-coordination.   Gloria Kramer, [place] [phone]   Note: PRIMARY CARE PROVIDER Shawnee Knapp, [place] [phone]

## 2018-02-14 ENCOUNTER — Ambulatory Visit: Payer: POS | Admitting: Hematology and Oncology

## 2018-04-17 ENCOUNTER — Other Ambulatory Visit: Payer: Self-pay | Admitting: Family Medicine

## 2018-04-17 NOTE — Telephone Encounter (Signed)
Requested medication (s) are due for refill today: yes  Requested medication (s) are on the active medication list: yes  Last refill:  09/30/17 for 90 tabs and 1 refill  Future visit scheduled: no  Notes to clinic:  Last TSH 01/25/2017, hypothyroid agents failed  Requested Prescriptions  Pending Prescriptions Disp Refills   levothyroxine (SYNTHROID, LEVOTHROID) 112 MCG tablet [Pharmacy Med Name: L-THYROXINE (SYNTHROID) TABS 112MCG] 90 tablet 4    Sig: TAKE 1 TABLET DAILY BEFORE BREAKFAST     Endocrinology:  Hypothyroid Agents Failed - 04/17/2018  9:17 AM      Failed - TSH needs to be rechecked within 3 months after an abnormal result. Refill until TSH is due.      Failed - TSH in normal range and within 360 days    TSH  Date Value Ref Range Status  01/25/2017 0.73 0.41 - 5.90 Final         Passed - Valid encounter within last 12 months    Recent Outpatient Visits          8 months ago Annual physical exam   [facility], Laurey Arrow, MD   3 years ago Generalized abdominal pain   [facility], Laurey Arrow, MD

## 2018-05-06 ENCOUNTER — Other Ambulatory Visit: Payer: Self-pay

## 2018-05-06 DIAGNOSIS — D509 Iron deficiency anemia, unspecified: Secondary | ICD-10-CM

## 2018-05-06 DIAGNOSIS — C50212 Malignant neoplasm of upper-inner quadrant of left female breast: Secondary | ICD-10-CM

## 2018-05-06 DIAGNOSIS — Z170 Estrogen receptor positive status [ER+]: Principal | ICD-10-CM

## 2018-05-07 ENCOUNTER — Inpatient Hospital Stay: Payer: POS | Attending: Oncology

## 2018-05-07 DIAGNOSIS — C50212 Malignant neoplasm of upper-inner quadrant of left female breast: Secondary | ICD-10-CM | POA: Diagnosis present

## 2018-05-07 DIAGNOSIS — Z79810 Long term (current) use of selective estrogen receptor modulators (SERMs): Secondary | ICD-10-CM | POA: Diagnosis not present

## 2018-05-07 DIAGNOSIS — Z79899 Other long term (current) drug therapy: Secondary | ICD-10-CM | POA: Insufficient documentation

## 2018-05-07 DIAGNOSIS — Z923 Personal history of irradiation: Secondary | ICD-10-CM | POA: Diagnosis not present

## 2018-05-07 DIAGNOSIS — D509 Iron deficiency anemia, unspecified: Secondary | ICD-10-CM

## 2018-05-07 DIAGNOSIS — Z170 Estrogen receptor positive status [ER+]: Secondary | ICD-10-CM | POA: Diagnosis not present

## 2018-05-07 LAB — CBC WITH DIFFERENTIAL (CANCER CENTER ONLY)
Abs Immature Granulocytes: 0.01 10*3/uL (ref 0.00–0.07)
Basophils Absolute: 0 10*3/uL (ref 0.0–0.1)
Basophils Relative: 0 %
Eosinophils Absolute: 0.1 10*3/uL (ref 0.0–0.5)
Eosinophils Relative: 1 %
HEMATOCRIT: 37.9 % (ref 36.0–46.0)
Hemoglobin: 12.9 g/dL (ref 12.0–15.0)
Immature Granulocytes: 0 %
Lymphocytes Relative: 11 %
Lymphs Abs: 0.7 10*3/uL (ref 0.7–4.0)
MCH: 31.9 pg (ref 26.0–34.0)
MCHC: 34 g/dL (ref 30.0–36.0)
MCV: 93.8 fL (ref 80.0–100.0)
Monocytes Absolute: 0.3 10*3/uL (ref 0.1–1.0)
Monocytes Relative: 6 %
NRBC: 0 % (ref 0.0–0.2)
Neutro Abs: 4.6 10*3/uL (ref 1.7–7.7)
Neutrophils Relative %: 82 %
Platelet Count: 203 10*3/uL (ref 150–400)
RBC: 4.04 MIL/uL (ref 3.87–5.11)
RDW: 12.8 % (ref 11.5–15.5)
WBC Count: 5.7 10*3/uL (ref 4.0–10.5)

## 2018-05-07 LAB — CMP (CANCER CENTER ONLY)
ALBUMIN: 3.9 g/dL (ref 3.5–5.0)
ALT: 12 U/L (ref 0–44)
AST: 21 U/L (ref 15–41)
Alkaline Phosphatase: 46 U/L (ref 38–126)
Anion gap: 9 (ref 5–15)
BILIRUBIN TOTAL: 0.6 mg/dL (ref 0.3–1.2)
BUN: 12 mg/dL (ref 6–20)
CO2: 27 mmol/L (ref 22–32)
Calcium: 8.8 mg/dL — ABNORMAL LOW (ref 8.9–10.3)
Chloride: 106 mmol/L (ref 98–111)
Creatinine: 0.8 mg/dL (ref 0.44–1.00)
GFR, Est AFR Am: >60 mL/min (ref >60)
GFR, Estimated: >60 mL/min (ref >60)
GLUCOSE: 94 mg/dL (ref 70–99)
Potassium: 4 mmol/L (ref 3.5–5.1)
Sodium: 142 mmol/L (ref 135–145)
Total Protein: 6.6 g/dL (ref 6.5–8.1)

## 2018-05-07 LAB — IRON AND TIBC
Iron: 81 ug/dL (ref 41–142)
Saturation Ratios: 28 % (ref 21–57)
TIBC: 290 ug/dL (ref 236–444)
UIBC: 209 ug/dL (ref 120–384)

## 2018-05-07 LAB — FERRITIN: Ferritin: 57 ng/mL (ref 11–307)

## 2018-05-09 NOTE — Progress Notes (Signed)
Patient Care Team: Shawnee Knapp, MD as PCP - General (Family Medicine) Nicholas Lose, MD as Consulting Physician (Hematology and Oncology) Louretta Shorten, MD as Consulting Physician (Obstetrics and Gynecology) Kyung Rudd, MD as Consulting Physician (Radiation Oncology) Jovita Kussmaul, MD as Consulting Physician (General Surgery) Delice Bison, Charlestine Massed, NP as Nurse Practitioner (Hematology and Oncology)  DIAGNOSIS:    ICD-10-CM   1. Malignant neoplasm of upper-inner quadrant of left breast in female, estrogen receptor positive ([place]) C50.212    Z17.0     SUMMARY OF ONCOLOGIC HISTORY:   Malignant neoplasm of upper-inner quadrant of left breast in female, estrogen receptor positive ([place])   07/09/2017 Initial Diagnosis    Palpable mass left breast 3 cm from the nipple measuring 1.6 x 1.8 x 2.5 cm, biopsy revealed grade 2 IDC ER 95%, PR 90%, Ki-67 15%, HER-2 negative ratio 1.26, T2 N0 stage Ib clinical stage AJCC 8    07/27/2017 Genetic Testing    CHEK2 p.M381V VUS identified on the CancerNext panel.  The CancerNext gene panel offered by [employer] includes sequencing and rearrangement analysis for the following 34 genes:   APC, ATM, BARD1, BMPR1A, BRCA1, BRCA2, BRIP1, CDH1, CDK4, CDKN2A, CHEK2, DICER1, HOXB13, EPCAM, GREM1, MLH1, MRE11A, MSH2, MSH6, MUTYH, NBN, NF1, PALB2, PMS2, POLD1, POLE, PTEN, RAD50, RAD51C, RAD51D, SMAD4, SMARCA4, STK11, and TP53.  The report date is Jul 27, 2017.    08/27/2017 Surgery    Left lumpectomy: IDC grade 2, 2.2 cm, low-grade DCIS, lymphovascular invasion identified, margins negative, 1 lymph node with micrometastatic disease, 3 additional lymph nodes negative, ER 95%, PR 90%, Ki-67 15%, HER-2 negative ratio 1.26, T2N1 mic stage Ib    08/27/2017 Oncotype testing    17/5%    09/03/2017 Cancer Staging    Staging form: Breast, AJCC 8th Edition - Pathologic: Stage IB (pT2, pN50m, cM0, G2, ER+, PR+, HER2-) - Signed by GNicholas Lose MD on 09/03/2017    10/15/2017 -  11/19/2017 Radiation Therapy    Adjuvant XRT    12/17/2017 -  Anti-estrogen oral therapy    Tamoxifen '20mg'$  daily     CHIEF COMPLIANT: Follow-up of tamoxifen therapy  INTERVAL HISTORY: Gloria GOLEMANis a 53y.o. with above-mentioned history of left breast cancer who underwent a lumpectomy, adjuvant radiation and is currently on antiestrogen therapy with tamoxifen. Her most recent breast MRI on 02/06/18 showed no evidence of malignancy. She presents to the clinic alone today and is tolerating tamoxifen very well. She notes occasional hot flashes, vision changes she attributes to aging, joint pain, and has not menstruated since Thanksgiving. She reports occasional pain and swelling in her left arm. She exercises regularly and questioned if medication was necessary to prevent blood clots from tamoxifen. She reviewed her medication list with me. Her lab work from 05/06/49 showed: iron saturation 28%, ferritin 57, Hg 12.9.   REVIEW OF SYSTEMS:   Constitutional: Denies fevers, chills or abnormal weight loss (+) hot flashes Eyes: (+) vision changes Ears, nose, mouth, throat, and face: Denies mucositis or sore throat Respiratory: Denies cough, dyspnea or wheezes Cardiovascular: Denies palpitation, chest discomfort Gastrointestinal: Denies nausea, heartburn or change in bowel habits Skin: Denies abnormal skin rashes MSK: (+) joint pain GYN: (+) cessation of menstrual cycles Lymphatics: Denies easy bruising (+) lymphedema in left arm and breast Neurological: Denies numbness, tingling or new weaknesses Behavioral/Psych: Mood is stable, no new changes  Extremities: No lower extremity edema Breast: denies any pain or lumps or nodules in either breasts All  other systems were reviewed with the patient and are negative.  I have reviewed the past medical history, past surgical history, social history and family history with the patient and they are unchanged from previous note.  ALLERGIES:  is allergic  to penicillins.  MEDICATIONS:  Current Outpatient Medications  Medication Sig Dispense Refill  . b complex vitamins capsule Take 1 capsule by mouth daily.    Marland Kitchen levothyroxine (SYNTHROID, LEVOTHROID) 112 MCG tablet TAKE 1 TABLET DAILY BEFORE BREAKFAST 90 tablet 4  . Polysaccharide Iron Complex (NOVAFERRUM 50) 50 MG CAPS Take 50 mg by mouth daily. 90 capsule   . tamoxifen (NOLVADEX) 20 MG tablet Take 1 tablet (20 mg total) by mouth daily. 90 tablet 3   No current facility-administered medications for this visit.     PHYSICAL EXAMINATION: ECOG PERFORMANCE STATUS: 0 - Asymptomatic  Vitals:   05/14/18 1351  BP: 130/81  Pulse: 99  Resp: 19  Temp: 98.3 F (36.8 C)  SpO2: 100%   Filed Weights   05/14/18 1351  Weight: 122 lb 8 oz (55.6 kg)    GENERAL: alert, no distress and comfortable SKIN: skin color, texture, turgor are normal, no rashes or significant lesions EYES: normal, Conjunctiva are pink and non-injected, sclera clear OROPHARYNX: no exudate, no erythema and lips, buccal mucosa, and tongue normal  NECK: supple, thyroid normal size, non-tender, without nodularity LYMPH: no palpable lymphadenopathy in the cervical, axillary or inguinal LUNGS: clear to auscultation and percussion with normal breathing effort HEART: regular rate & rhythm and no murmurs and no lower extremity edema ABDOMEN: abdomen soft, non-tender and normal bowel sounds MUSCULOSKELETAL: no cyanosis of digits and no clubbing  NEURO: alert & oriented x 3 with fluent speech, no focal motor/sensory deficits EXTREMITIES: No lower extremity edema  LABORATORY DATA:  I have reviewed the data as listed CMP Latest Ref Rng & Units 05/07/2018 01/25/2017 07/21/2014  Glucose 70 - 99 mg/dL 94 - 98  BUN 6 - 20 mg/dL '12 12 16  '$ Creatinine 0.44 - 1.00 mg/dL 0.80 0.6 0.74  Sodium 135 - 145 mmol/L 142 140 138  Potassium 3.5 - 5.1 mmol/L 4.0 4.6 4.1  Chloride 98 - 111 mmol/L 106 - 104  CO2 22 - 32 mmol/L 27 - 21  Calcium 8.9  - 10.3 mg/dL 8.8(L) - 9.5  Total Protein 6.5 - 8.1 g/dL 6.6 - 7.2  Total Bilirubin 0.3 - 1.2 mg/dL 0.6 - 1.3(H)  Alkaline Phos 38 - 126 U/L 46 - 55  AST 15 - 41 U/L 21 - 17  ALT 0 - 44 U/L 12 - 12    Lab Results  Component Value Date   WBC 5.7 05/07/2018   HGB 12.9 05/07/2018   HCT 37.9 05/07/2018   MCV 93.8 05/07/2018   PLT 203 05/07/2018   NEUTROABS 4.6 05/07/2018    ASSESSMENT & PLAN:  Malignant neoplasm of upper-inner quadrant of left breast in female, estrogen receptor positive ([place]) 08/27/2017:Left lumpectomy: IDC grade 2, 2.2 cm, low-grade DCIS, lymphovascular invasion identified, margins negative, 1 lymph node with micrometastatic disease, 3 additional lymph nodes negative, ER 95%, PR 90%, Ki-67 15%, HER-2 negative ratio 1.26, T2N1 mic stage Ib Oncotype DX recurrence score 17: Risk of distant recurrence 5% Adjuvant radiation therapy 10/13/2017 to 11/19/2017  Treatment plan: Oral antiestrogen therapy with tamoxifen 20 mg daily x10 years Menstrual cycles have stopped.  Tamoxifen toxicities: Denies any significant side effects. Denies any significant hot flashes or arthralgias.  Patient is exercising frequently and has been  eating very healthy.  Breast cancer surveillance: 1.  Breast exam 05/14/2018: Benign 2.  Mammogram will be done in May 2020.  Return to clinic in 1 year for follow-up     No orders of the defined types were placed in this encounter.  The patient has a good understanding of the overall plan. she agrees with it. she will call with any problems that may develop before the next visit here.  Nicholas Lose, MD 05/14/2018  Julious Oka Dorshimer am acting as scribe for Dr. Nicholas Lose.  I have reviewed the above documentation for accuracy and completeness, and I agree with the above.

## 2018-05-14 ENCOUNTER — Telehealth: Payer: Self-pay | Admitting: Hematology and Oncology

## 2018-05-14 ENCOUNTER — Other Ambulatory Visit: Payer: Self-pay

## 2018-05-14 ENCOUNTER — Inpatient Hospital Stay (HOSPITAL_BASED_OUTPATIENT_CLINIC_OR_DEPARTMENT_OTHER): Payer: POS | Admitting: Hematology and Oncology

## 2018-05-14 DIAGNOSIS — Z923 Personal history of irradiation: Secondary | ICD-10-CM | POA: Diagnosis not present

## 2018-05-14 DIAGNOSIS — Z170 Estrogen receptor positive status [ER+]: Secondary | ICD-10-CM | POA: Diagnosis not present

## 2018-05-14 DIAGNOSIS — Z79899 Other long term (current) drug therapy: Secondary | ICD-10-CM

## 2018-05-14 DIAGNOSIS — C50212 Malignant neoplasm of upper-inner quadrant of left female breast: Secondary | ICD-10-CM

## 2018-05-14 DIAGNOSIS — Z79810 Long term (current) use of selective estrogen receptor modulators (SERMs): Secondary | ICD-10-CM | POA: Diagnosis not present

## 2018-05-14 MED ORDER — TAMOXIFEN CITRATE 20 MG PO TABS: 20.0000 mg | ORAL_TABLET | Freq: Every day | ORAL | 3 refills | Status: DC

## 2018-05-14 NOTE — Assessment & Plan Note (Addendum)
08/27/2017:Left lumpectomy: IDC grade 2, 2.2 cm, low-grade DCIS, lymphovascular invasion identified, margins negative, 1 lymph node with micrometastatic disease, 3 additional lymph nodes negative, ER 95%, PR 90%, Ki-67 15%, HER-2 negative ratio 1.26, T2N1 mic stage Ib Oncotype DX recurrence score 17: Risk of distant recurrence 5% Adjuvant radiation therapy 10/13/2017 to 11/19/2017  Treatment plan: Oral antiestrogen therapy with tamoxifen 20 mg daily x10 years  Tamoxifen toxicities: Denies any significant side effects. Denies any significant hot flashes or arthralgias.  Patient is exercising frequently and has been eating very healthy.  Breast cancer surveillance: 1.  Breast exam 05/14/2018: Benign 2.  Mammogram will be done in May 2020.  Return to clinic in 1 year for follow-up

## 2018-05-14 NOTE — Telephone Encounter (Signed)
Gave avs and calendar ° °

## 2018-07-08 ENCOUNTER — Other Ambulatory Visit: Payer: Self-pay

## 2018-07-08 ENCOUNTER — Ambulatory Visit
Admission: RE | Admit: 2018-07-08 | Discharge: 2018-07-08 | Disposition: A | Discharge status: Home / Self Care | Payer: POS | Source: Ambulatory Visit | Attending: Adult Health | Admitting: Adult Health

## 2018-07-08 ENCOUNTER — Other Ambulatory Visit: Payer: Self-pay | Admitting: Adult Health

## 2018-07-08 DIAGNOSIS — C50212 Malignant neoplasm of upper-inner quadrant of left female breast: Secondary | ICD-10-CM

## 2018-07-08 DIAGNOSIS — Z170 Estrogen receptor positive status [ER+]: Secondary | ICD-10-CM

## 2018-07-08 HISTORY — DX: Personal history of irradiation: Z92.3

## 2018-07-09 ENCOUNTER — Telehealth: Payer: Self-pay | Admitting: Adult Health

## 2018-07-09 DIAGNOSIS — Z1231 Encounter for screening mammogram for malignant neoplasm of breast: Secondary | ICD-10-CM

## 2018-07-09 NOTE — Telephone Encounter (Signed)
Called and talked to patient about her mammogram results and her breast density category D. We reviewed indications for breast MRI in 01/2019.  We reviewed risks including false positives which can happen with breast MRI.  Reviewed differences between cost, and full MRI versus FAST MRI.  Patient would like for me to order full MRI, and will see what out of pocket cost will be.  I let her know that I am happy to order FAST MRI if there are any insurance issues, and the out of pocket cost is $400.  Patient thanked me for call.  Orders placed for breast MRI.    Time spent: 10 minutes  Wilber Bihari, NP

## 2018-07-10 ENCOUNTER — Encounter: Payer: Self-pay | Admitting: Adult Health

## 2019-02-15 ENCOUNTER — Ambulatory Visit
Admission: RE | Admit: 2019-02-15 | Discharge: 2019-02-15 | Disposition: A | Discharge status: Home / Self Care | Payer: POS | Source: Ambulatory Visit | Attending: Adult Health | Admitting: Adult Health

## 2019-02-15 ENCOUNTER — Other Ambulatory Visit: Payer: Self-pay

## 2019-02-15 DIAGNOSIS — Z1231 Encounter for screening mammogram for malignant neoplasm of breast: Secondary | ICD-10-CM

## 2019-02-15 MED ORDER — GADOBUTROL 1 MMOL/ML IV SOLN
6.0000 mL | Freq: Once | INTRAVENOUS | Status: AC | PRN
  Administered 2019-02-15: 6 mL via INTRAVENOUS

## 2019-02-17 ENCOUNTER — Other Ambulatory Visit: Payer: Self-pay | Admitting: Adult Health

## 2019-02-17 DIAGNOSIS — C50212 Malignant neoplasm of upper-inner quadrant of left female breast: Secondary | ICD-10-CM

## 2019-02-17 NOTE — Progress Notes (Signed)
Called patient and LMOM for her to call me back so that I can place orders for MR guided biopsy of breast x 3 based on MRI results.    Wilber Bihari, NP

## 2019-02-18 ENCOUNTER — Other Ambulatory Visit: Payer: Self-pay | Admitting: Adult Health

## 2019-02-18 ENCOUNTER — Telehealth: Payer: Self-pay | Admitting: Adult Health

## 2019-02-18 DIAGNOSIS — C50212 Malignant neoplasm of upper-inner quadrant of left female breast: Secondary | ICD-10-CM

## 2019-02-18 NOTE — Telephone Encounter (Signed)
Reviewed with Patinet the MRI results and that there were areas in her breast that were recommended to be biopsied.  I reviewed these locations, and that I placed orders for the biopsy to be completed.  I reviewed the fact that MRIs do have false positive rate.  I sent Volanda Napoleon a staff message that Aleathia and I had talked, the orders were in, and that she is ready for scheduling.  Results forwarded to Dr. Lindi Adie and Dr. Marlou Starks.  Wilber Bihari, NP

## 2019-02-23 ENCOUNTER — Encounter: Payer: Self-pay | Admitting: Hematology and Oncology

## 2019-03-06 ENCOUNTER — Ambulatory Visit
Admission: RE | Admit: 2019-03-06 | Discharge: 2019-03-06 | Disposition: A | Discharge status: Home / Self Care | Payer: POS | Source: Ambulatory Visit | Attending: Adult Health | Admitting: Adult Health

## 2019-03-06 ENCOUNTER — Other Ambulatory Visit: Payer: Self-pay

## 2019-03-06 ENCOUNTER — Other Ambulatory Visit: Payer: Self-pay | Admitting: General Practice

## 2019-03-06 DIAGNOSIS — C50212 Malignant neoplasm of upper-inner quadrant of left female breast: Secondary | ICD-10-CM

## 2019-03-06 DIAGNOSIS — Z170 Estrogen receptor positive status [ER+]: Secondary | ICD-10-CM

## 2019-03-06 HISTORY — PX: BREAST BIOPSY: SHX20

## 2019-03-06 MED ORDER — GADOBUTROL 1 MMOL/ML IV SOLN
5.0000 mL | Freq: Once | INTRAVENOUS | Status: AC | PRN
  Administered 2019-03-06: 5 mL via INTRAVENOUS

## 2019-03-11 ENCOUNTER — Encounter: Payer: Self-pay | Admitting: Hematology and Oncology

## 2019-03-20 ENCOUNTER — Ambulatory Visit: Payer: POS | Admitting: Hematology and Oncology

## 2019-03-26 NOTE — Progress Notes (Signed)
Patient Care Team: Shawnee Knapp, MD as PCP - General (Family Medicine) Nicholas Lose, MD as Consulting Physician (Hematology and Oncology) Louretta Shorten, MD as Consulting Physician (Obstetrics and Gynecology) Kyung Rudd, MD as Consulting Physician (Radiation Oncology) Jovita Kussmaul, MD as Consulting Physician (General Surgery) Delice Bison, Charlestine Massed, NP as Nurse Practitioner (Hematology and Oncology)  DIAGNOSIS:    ICD-10-CM   1. Malignant neoplasm of upper-inner quadrant of left breast in female, estrogen receptor positive ([place])  C50.212    Z17.0     SUMMARY OF ONCOLOGIC HISTORY: Oncology History  Malignant neoplasm of upper-inner quadrant of left breast in female, estrogen receptor positive ([place])  07/09/2017 Initial Diagnosis   Palpable mass left breast 3 cm from the nipple measuring 1.6 x 1.8 x 2.5 cm, biopsy revealed grade 2 IDC ER 95%, PR 90%, Ki-67 15%, HER-2 negative ratio 1.26, T2 N0 stage Ib clinical stage AJCC 8   07/27/2017 Genetic Testing   CHEK2 p.M381V VUS identified on the CancerNext panel.  The CancerNext gene panel offered by [employer] includes sequencing and rearrangement analysis for the following 34 genes:   APC, ATM, BARD1, BMPR1A, BRCA1, BRCA2, BRIP1, CDH1, CDK4, CDKN2A, CHEK2, DICER1, HOXB13, EPCAM, GREM1, MLH1, MRE11A, MSH2, MSH6, MUTYH, NBN, NF1, PALB2, PMS2, POLD1, POLE, PTEN, RAD50, RAD51C, RAD51D, SMAD4, SMARCA4, STK11, and TP53.  The report date is Jul 27, 2017.   08/27/2017 Surgery   Left lumpectomy: IDC grade 2, 2.2 cm, low-grade DCIS, lymphovascular invasion identified, margins negative, 1 lymph node with micrometastatic disease, 3 additional lymph nodes negative, ER 95%, PR 90%, Ki-67 15%, HER-2 negative ratio 1.26, T2N1 mic stage Ib   08/27/2017 Oncotype testing   17/5%   09/03/2017 Cancer Staging   Staging form: Breast, AJCC 8th Edition - Pathologic: Stage IB (pT2, pN62m, cM0, G2, ER+, PR+, HER2-) - Signed by GNicholas Lose MD on 09/03/2017    10/15/2017 - 11/19/2017 Radiation Therapy   Adjuvant XRT   12/17/2017 -  Anti-estrogen oral therapy   Tamoxifen '20mg'$  daily     CHIEF COMPLIANT: Follow-up of left breast cancer on tamoxifen therapy  INTERVAL HISTORY: Gloria Kramer a 54y.o. with above-mentioned history of left breast cancer who underwent a lumpectomy, radiation, and is currently on antiestrogen therapy with tamoxifen. Mammogram on 07/08/18 showed no evidence of malignancy bilaterally. Breast MRI on 02/15/19 showed an indeterminate 0.6cm mass in the left breast, enhancement behind the left nipple, and numerous foci of non-mass enhancement in the right breast. Biopsy on 03/06/19 showed no evidence of malignancy in both breasts. She presents to the clinic alone today for follow-up.   ALLERGIES:  is allergic to penicillins.  MEDICATIONS:  Current Outpatient Medications  Medication Sig Dispense Refill  . b complex vitamins capsule Take 1 capsule by mouth daily.    .Marland Kitchenlevothyroxine (SYNTHROID, LEVOTHROID) 112 MCG tablet TAKE 1 TABLET DAILY BEFORE BREAKFAST 90 tablet 4  . Polysaccharide Iron Complex (NOVAFERRUM 50) 50 MG CAPS Take 50 mg by mouth daily. 90 capsule   . tamoxifen (NOLVADEX) 20 MG tablet Take 1 tablet (20 mg total) by mouth daily. 90 tablet 3   No current facility-administered medications for this visit.    PHYSICAL EXAMINATION: ECOG PERFORMANCE STATUS: 1 - Symptomatic but completely ambulatory  Vitals:   03/27/19 1046  BP: 124/75  Pulse: 74  Resp: 18  Temp: (!) 97 F (36.1 C)  SpO2: 98%   Filed Weights   03/27/19 1046  Weight: 126 lb 4.8 oz (57.3 kg)  LABORATORY DATA:  I have reviewed the data as listed CMP Latest Ref Rng & Units 05/07/2018 01/25/2017 07/21/2014  Glucose 70 - 99 mg/dL 94 - 98  BUN 6 - 20 mg/dL '12 12 16  '$ Creatinine 0.44 - 1.00 mg/dL 0.80 0.6 0.74  Sodium 135 - 145 mmol/L 142 140 138  Potassium 3.5 - 5.1 mmol/L 4.0 4.6 4.1  Chloride 98 - 111 mmol/L 106 - 104  CO2 22 - 32 mmol/L  27 - 21  Calcium 8.9 - 10.3 mg/dL 8.8(L) - 9.5  Total Protein 6.5 - 8.1 g/dL 6.6 - 7.2  Total Bilirubin 0.3 - 1.2 mg/dL 0.6 - 1.3(H)  Alkaline Phos 38 - 126 U/L 46 - 55  AST 15 - 41 U/L 21 - 17  ALT 0 - 44 U/L 12 - 12    Lab Results  Component Value Date   WBC 5.7 05/07/2018   HGB 12.9 05/07/2018   HCT 37.9 05/07/2018   MCV 93.8 05/07/2018   PLT 203 05/07/2018   NEUTROABS 4.6 05/07/2018    ASSESSMENT & PLAN:  Malignant neoplasm of upper-inner quadrant of left breast in female, estrogen receptor positive ([place]) 08/27/2017:Left lumpectomy: IDC grade 2, 2.2 cm, low-grade DCIS, lymphovascular invasion identified, margins negative, 1 lymph node with micrometastatic disease, 3 additional lymph nodes negative, ER 95%, PR 90%, Ki-67 15%, HER-2 negative ratio 1.26, T2N1 mic stage Ib Oncotype DX recurrence score 17: Risk of distant recurrence 5% Adjuvant radiation therapy 10/13/2017 to 11/19/2017  Treatment plan: Oral antiestrogen therapy with tamoxifen 20 mg daily x10 years Menstrual cycles have stopped.  Tamoxifen toxicities: Denies any significant side effects. Denies any significant hot flashes or arthralgias.  Patient is exercising frequently and has been eating very healthy.  Breast cancer surveillance: 1.  Breast exam 03/27/2019: Benign 2.  Mammogram 07/08/2018: Postlumpectomy changes, benign, breast density category D 3.  Breast MRI 02/17/2019: Indeterminate 6 mm enhancing mass central left breast, indeterminate enhancement behind the left nipple, numerous foci of clumped non-mass enhancement throughout the right breast.  Right breast biopsies 03/06/2019: ADH with fibrocystic changes and benign breast tissue Pathology review: I discussed the benign findings noted on MRI.  We also discussed the false positive findings on breast MRIs as the drawback of doing these MRIs.    Because the area under the nipple could not be biopsied 67-monthfollow-up MRI was recommended. We have ordered  an MRI to be done in July.  I will call her with the results of the MRI.  I renewed her prescription for tamoxifen.  Return to clinic in 1 year for follow-up    No orders of the defined types were placed in this encounter.  The patient has a good understanding of the overall plan. she agrees with it. she will call with any problems that may develop before the next visit here.  Total time spent: 20 mins including face to face time and time spent for planning, charting and coordination of care  GNicholas Lose MD 03/27/2019  I, MCloyde ReamsDorshimer, am acting as scribe for Dr. VNicholas Lose  I have reviewed the above documentation for accuracy and completeness, and I agree with the above.

## 2019-03-27 ENCOUNTER — Other Ambulatory Visit: Payer: Self-pay

## 2019-03-27 ENCOUNTER — Inpatient Hospital Stay: Payer: POS | Attending: Hematology and Oncology | Admitting: Hematology and Oncology

## 2019-03-27 DIAGNOSIS — Z170 Estrogen receptor positive status [ER+]: Secondary | ICD-10-CM

## 2019-03-27 DIAGNOSIS — C50212 Malignant neoplasm of upper-inner quadrant of left female breast: Secondary | ICD-10-CM

## 2019-03-27 DIAGNOSIS — Z79810 Long term (current) use of selective estrogen receptor modulators (SERMs): Secondary | ICD-10-CM | POA: Insufficient documentation

## 2019-03-27 MED ORDER — TAMOXIFEN CITRATE 20 MG PO TABS: 20.0000 mg | ORAL_TABLET | Freq: Every day | ORAL | 3 refills | Status: DC

## 2019-03-27 NOTE — Assessment & Plan Note (Signed)
08/27/2017:Left lumpectomy: IDC grade 2, 2.2 cm, low-grade DCIS, lymphovascular invasion identified, margins negative, 1 lymph node with micrometastatic disease, 3 additional lymph nodes negative, ER 95%, PR 90%, Ki-67 15%, HER-2 negative ratio 1.26, T2N1 mic stage Ib Oncotype DX recurrence score 17: Risk of distant recurrence 5% Adjuvant radiation therapy 10/13/2017 to 11/19/2017  Treatment plan: Oral antiestrogen therapy with tamoxifen 20 mg daily x10 years Menstrual cycles have stopped.  Tamoxifen toxicities: Denies any significant side effects. Denies any significant hot flashes or arthralgias.  Patient is exercising frequently and has been eating very healthy.  Breast cancer surveillance: 1.  Breast exam 03/27/2019: Benign 2.  Mammogram 07/08/2018: Postlumpectomy changes, benign, breast density category D 3.  Breast MRI 02/17/2019: Indeterminate 6 mm enhancing mass central left breast, indeterminate enhancement behind the left nipple, numerous foci of clumped non-mass enhancement throughout the right breast.  Right breast biopsies 03/06/2019: ADH with fibrocystic changes and benign breast tissue Pathology review: I discussed the benign findings noted on MRI.  We also discussed the false positive findings on breast MRIs as the drawback of doing these MRIs.  Given her extremely high breast density unfortunately we will need to do this annually.   Return to clinic in 1 year for follow-up

## 2019-03-28 ENCOUNTER — Telehealth: Payer: Self-pay | Admitting: Hematology and Oncology

## 2019-03-28 NOTE — Telephone Encounter (Signed)
I talk with patient regarding schedule  

## 2019-05-12 ENCOUNTER — Encounter (INDEPENDENT_AMBULATORY_CARE_PROVIDER_SITE_OTHER): Payer: Self-pay

## 2019-05-14 ENCOUNTER — Ambulatory Visit: Payer: POS | Admitting: Hematology and Oncology

## 2019-06-20 ENCOUNTER — Encounter: Payer: Self-pay | Admitting: Hematology and Oncology

## 2019-06-23 ENCOUNTER — Other Ambulatory Visit: Payer: Self-pay | Admitting: Hematology and Oncology

## 2019-06-23 DIAGNOSIS — Z9889 Other specified postprocedural states: Secondary | ICD-10-CM

## 2019-07-09 ENCOUNTER — Other Ambulatory Visit: Payer: Self-pay

## 2019-07-09 ENCOUNTER — Ambulatory Visit
Admission: RE | Admit: 2019-07-09 | Discharge: 2019-07-09 | Disposition: A | Discharge status: Home / Self Care | Payer: POS | Source: Ambulatory Visit | Attending: Hematology and Oncology | Admitting: Hematology and Oncology

## 2019-07-09 DIAGNOSIS — Z9889 Other specified postprocedural states: Secondary | ICD-10-CM

## 2019-08-05 LAB — HM PAP SMEAR: HM Pap smear: NEGATIVE

## 2019-08-21 ENCOUNTER — Other Ambulatory Visit: Payer: Self-pay

## 2019-08-21 ENCOUNTER — Telehealth: Payer: Self-pay | Admitting: General Practice

## 2019-08-21 MED ORDER — LEVOTHYROXINE SODIUM 112 MCG PO TABS: 112.0000 ug | ORAL_TABLET | Freq: Every day | ORAL | 0 refills | Status: DC

## 2019-08-21 NOTE — Telephone Encounter (Signed)
Pt called and is needing a medication refilled to get her threw to her physical appt that is sch for 10/17/19 With Dr. Carlota Raspberry.  SYNTHROID 112 MCG tablet [[id number]]  [facility] [place] [place]  Please advise.

## 2019-08-27 ENCOUNTER — Other Ambulatory Visit: Payer: Self-pay | Admitting: Hematology and Oncology

## 2019-08-28 ENCOUNTER — Other Ambulatory Visit: Payer: Self-pay | Admitting: Hematology and Oncology

## 2019-09-03 ENCOUNTER — Other Ambulatory Visit: Payer: POS

## 2019-10-17 ENCOUNTER — Ambulatory Visit (INDEPENDENT_AMBULATORY_CARE_PROVIDER_SITE_OTHER): Payer: POS | Admitting: Family Medicine

## 2019-10-17 ENCOUNTER — Other Ambulatory Visit: Payer: Self-pay

## 2019-10-17 ENCOUNTER — Encounter: Payer: Self-pay | Admitting: Family Medicine

## 2019-10-17 VITALS — BP 123/78 | HR 75 | Temp 98.2°F | Resp 15 | Ht 68.0 in | Wt 135.8 lb

## 2019-10-17 DIAGNOSIS — Z131 Encounter for screening for diabetes mellitus: Secondary | ICD-10-CM | POA: Diagnosis not present

## 2019-10-17 DIAGNOSIS — Z862 Personal history of diseases of the blood and blood-forming organs and certain disorders involving the immune mechanism: Secondary | ICD-10-CM

## 2019-10-17 DIAGNOSIS — Z0001 Encounter for general adult medical examination with abnormal findings: Secondary | ICD-10-CM

## 2019-10-17 DIAGNOSIS — Z13220 Encounter for screening for lipoid disorders: Secondary | ICD-10-CM

## 2019-10-17 DIAGNOSIS — E039 Hypothyroidism, unspecified: Secondary | ICD-10-CM

## 2019-10-17 DIAGNOSIS — Z0000 Encounter for general adult medical examination without abnormal findings: Secondary | ICD-10-CM

## 2019-10-17 MED ORDER — LEVOTHYROXINE SODIUM 112 MCG PO TABS: 112.0000 ug | ORAL_TABLET | Freq: Every day | ORAL | 3 refills | Status: DC

## 2019-10-17 NOTE — Progress Notes (Signed)
Subjective:  Patient ID: Gloria Kramer, female    DOB: 1965/09/22  Age: 54 y.o. MRN: [id number]  CC:  Chief Complaint  Patient presents with  . Annual Exam    pt is feeling well no concerns today she is requesting a refill of synthroid     HPI Gloria Kramer presents for   Annual physical exam.  New patient to me, previously followed by Dr. Brigitte Pulse.  Hypothyroidism: Lab Results  Component Value Date   TSH 15.600 (H) 10/17/2019  Taking medication daily. Synthroid 180mcg - few dose missed at times this week.  No new hot or cold intolerance. No new hair or skin changes, heart palpitations or new fatigue. No new unexplained weight changes.   Cancer Screening: Colon: no ready yet. Has discussed  Cervical: Pap testing June of this year, normal. unknown if HPV testing.  gynecology Dr. Corinna Capra. Plans on yearly testing. Baseline endometrial measurements.  History of breast cancer, oncologist Dr. Lindi Adie.up with Dr. Marlou Starks and  Surgeon Dr. Marlou Starks.   Initial diagnosis May 2019, left breast mass.  Left lumpectomy in July 2019.  Low-grade DCIS 1 lymph node with micrometastatic disease.  Adjuvant radiation therapy, and treated with tamoxifen for antiestrogen oral therapy.  Breast MRI 02/15/2019 with indeterminate 0.6 cm mass in the left breast, enhancement behind the left nipple, numerous foci of non-mass enhancement in the right breast.  Biopsy March 06, 2019 showed no evidence of malignancy in both breasts.  Follow-up MRI of breasts planned as unable to biopsy area under the nipple.  Planned MRI in July.  1 year follow-up with Dr. Lindi Adie planned. Unable to have MRI due to time. She plans on following up with Oncology and surgeon to decide on plan. Defers imaging at this time.   Immunization History  Administered Date(s) Administered  . Influenza Split 11/28/2010  . Tdap 01/16/2011  Covid 19  Vaccine: declines. No questions.  Flu vaccine: declined.  Shingles vaccine: has not had - still deciding.  ?  prevnar in 2017 or 2018 by Dr. Ernie Hew.  Depression screen [facility] 2/9 08/15/2017 07/18/2017 07/21/2014  Decreased Interest 0 0 0  Down, Depressed, Hopeless 0 0 0  PHQ - 2 Score 0 0 0    Hearing Screening   125Hz  250Hz  500Hz  1000Hz  2000Hz  3000Hz  4000Hz  6000Hz  8000Hz   Right ear:           Left ear:             Visual Acuity Screening   Right eye Left eye Both eyes  Without correction:     With correction: 20/20 20/15 20/15   optho once per year,  Dr. Nicki Reaper, appt in July. wears glasses. Monitoring for vision with tamoxifen.   Dental: every 6 months.   Exercise: took summer off, but usually 3 miles jog/walk/run - 3 times per week.   Married, 5 children- 22, 78 - both in [place], 70, [place], [place].  Lab Results  Component Value Date   CHOL 168 10/17/2019   HDL 61 10/17/2019   LDLCALC 92 10/17/2019   TRIG 79 10/17/2019   CHOLHDL 2.8 10/17/2019    History Patient Active Problem List   Diagnosis Date Noted  . Genetic testing 07/30/2017  . Family history of breast cancer   . Family history of brain cancer   . Malignant neoplasm of upper-inner quadrant of left breast in female, estrogen receptor positive ([place]) 07/18/2017  . Iron deficiency anemia 03/08/2017  . Hypothyroid  01/16/2011  . Perimenopause 01/16/2011   Past Medical History:  Diagnosis Date  . Anemia    iron deficiency from heavy periods  . Cancer ([place]) 07/2017   left breast cancer  . Family history of brain cancer   . Family history of breast cancer   . Hypothyroidism    Hoshimoto's thyroiditis  . Personal history of radiation therapy   . Thyroid disease    Past Surgical History:  Procedure Laterality Date  . BREAST LUMPECTOMY Left 07/2017  . BREAST LUMPECTOMY WITH AXILLARY LYMPH NODE BIOPSY Left 08/27/2017   Procedure: LEFT BREAST LUMPECTOMY WITH AXILLARY LYMPH NODE BIOPSY;  Surgeon: Jovita Kussmaul, MD;  Location: [facility];  Service: General;  Laterality: Left;  .  WISDOM TOOTH EXTRACTION     Allergies  Allergen Reactions  . Penicillins Rash    Unsure reaction was when she was a child   Prior to Admission medications   Medication Sig Start Date End Date Taking? Authorizing Provider  b complex vitamins capsule Take 1 capsule by mouth daily. 07/24/17  Yes Nicholas Lose, MD  levothyroxine (SYNTHROID) 112 MCG tablet Take 1 tablet (112 mcg total) by mouth daily before breakfast. 08/21/19  Yes Wendie Agreste, MD  Polysaccharide Iron Complex (NOVAFERRUM 50) 50 MG CAPS Take 50 mg by mouth daily. 07/24/17  Yes Nicholas Lose, MD  tamoxifen (NOLVADEX) 20 MG tablet Take 1 tablet (20 mg total) by mouth daily. 03/27/19  Yes Nicholas Lose, MD   Social History   Socioeconomic History  . Marital status: Married    Spouse name: Not on file  . Number of children: 5  . Years of education: Not on file  . Highest education level: Not on file  Occupational History  . Not on file  Tobacco Use  . Smoking status: Never Smoker  . Smokeless tobacco: Never Used  Vaping Use  . Vaping Use: Never used  Substance and Sexual Activity  . Alcohol use: Yes    Alcohol/week: 0.0 standard drinks    Comment: 1 drink or beer or wine a few nights a week   . Drug use: Never  . Sexual activity: Yes    Birth control/protection: None  Other Topics Concern  . Not on file  Social History Narrative  . Not on file   Social Determinants of Health   Financial Resource Strain:   . Difficulty of Paying Living Expenses: Not on file  Food Insecurity:   . Worried About Charity fundraiser in the Last Year: Not on file  . Ran Out of Food in the Last Year: Not on file  Transportation Needs:   . Lack of Transportation (Medical): Not on file  . Lack of Transportation (Non-Medical): Not on file  Physical Activity:   . Days of Exercise per Week: Not on file  . Minutes of Exercise per Session: Not on file  Stress:   . Feeling of Stress : Not on file  Social Connections:   . Frequency  of Communication with Friends and Family: Not on file  . Frequency of Social Gatherings with Friends and Family: Not on file  . Attends Religious Services: Not on file  . Active Member of Clubs or Organizations: Not on file  . Attends Archivist Meetings: Not on file  . Marital Status: Not on file  Intimate Partner Violence:   . Fear of Current or Ex-Partner: Not on file  . Emotionally Abused: Not on file  . Physically  Abused: Not on file  . Sexually Abused: Not on file    Review of Systems 13 point review of systems per patient health survey noted.  Negative other than as indicated above or in HPI.    Objective:   Vitals:   10/17/19 0841  BP: 123/78  Pulse: 75  Resp: 15  Temp: 98.2 F (36.8 C)  TempSrc: Temporal  SpO2: 97%  Weight: 135 lb 12.8 oz (61.6 kg)  Height: 5\' 8"  (1.727 m)     Physical Exam Vitals reviewed.  Constitutional:      Appearance: She is well-developed.  HENT:     Head: Normocephalic and atraumatic.     Right Ear: External ear normal.     Left Ear: External ear normal.  Eyes:     Conjunctiva/sclera: Conjunctivae normal.     Pupils: Pupils are equal, round, and reactive to light.  Neck:     Thyroid: No thyromegaly.  Cardiovascular:     Rate and Rhythm: Normal rate and regular rhythm.     Heart sounds: Normal heart sounds. No murmur heard.   Pulmonary:     Effort: Pulmonary effort is normal. No respiratory distress.     Breath sounds: Normal breath sounds. No wheezing.  Abdominal:     General: Bowel sounds are normal.     Palpations: Abdomen is soft.     Tenderness: There is no abdominal tenderness.  Musculoskeletal:        General: No tenderness. Normal range of motion.     Cervical back: Normal range of motion and neck supple.  Lymphadenopathy:     Cervical: No cervical adenopathy.  Skin:    General: Skin is warm and dry.     Findings: No rash.  Neurological:     Mental Status: She is alert and oriented to person, place,  and time.  Psychiatric:        Behavior: Behavior normal.        Thought Content: Thought content normal.        Assessment & Plan:  Gloria Kramer is a 54 y.o. female . Annual physical exam Wellness examination - Plan: Lipid panel, CBC with Differential/Platelet, Hemoglobin A1c, Comprehensive metabolic panel, TSH  - -anticipatory guidance as below in AVS, screening labs above. Health maintenance items as above in HPI discussed/recommended as applicable.   -Vaccines recommended as above, declined at this time.  -Colon cancer screening options recommended.  Declined at present time.  Hypothyroidism, unspecified type - Plan: TSH, levothyroxine (SYNTHROID) 112 MCG tablet  -Check TSH, continue same dose Synthroid  History of anemia - Plan: CBC with Differential/Platelet  -Check CBC  Screening for diabetes mellitus - Plan: Hemoglobin A1c, Comprehensive metabolic panel  Screening for hyperlipidemia - Plan: Lipid panel   Meds ordered this encounter  Medications  . levothyroxine (SYNTHROID) 112 MCG tablet    Sig: Take 1 tablet (112 mcg total) by mouth daily before breakfast.    Dispense:  90 tablet    Refill:  3   Patient Instructions    I do recommend colon cancer screening - let me know if I can help order that test.   No change in thyroid medicine for now.  If levels are off, we can repeat that as a lab only visit in the next 6 weeks or so.  Keep up the good work with exercise.  See other recommendations below.  Let me know if you have questions on vaccines we discussed today.  Take care.  Keeping You Healthy  Get These Tests  Blood Pressure- Have your blood pressure checked by your healthcare provider at least once a year.  Normal blood pressure is 120/80.  Weight- Have your body mass index (BMI) calculated to screen for obesity.  BMI is a measure of body fat based on height and weight.  You can calculate your own BMI at [employer].it  Cholesterol- Have  your cholesterol checked every year.  Diabetes- Have your blood sugar checked every year if you have high blood pressure, high cholesterol, a family history of diabetes or if you are overweight.  Pap Test - Have a pap test every 1 to 5 years if you have been sexually active.  If you are older than 65 and recent pap tests have been normal you may not need additional pap tests.  In addition, if you have had a hysterectomy  for benign disease additional pap tests are not necessary.  Mammogram-Yearly mammograms are essential for early detection of breast cancer  Screening for Colon Cancer- Colonoscopy starting at age 70. Screening may begin sooner depending on your family history and other health conditions.  Follow up colonoscopy as directed by your Gastroenterologist.  Screening for Osteoporosis- Screening begins at age 7 with bone density scanning, sooner if you are at higher risk for developing Osteoporosis.  Get these medicines  Calcium with Vitamin D- Your body requires 1200-1500 mg of Calcium a day and [phone] IU of Vitamin D a day.  You can only absorb 500 mg of Calcium at a time therefore Calcium must be taken in 2 or 3 separate doses throughout the day.  Hormones- Hormone therapy has been associated with increased risk for certain cancers and heart disease.  Talk to your healthcare provider about if you need relief from menopausal symptoms.  Aspirin- Ask your healthcare provider about taking Aspirin to prevent Heart Disease and Stroke.  Get these Immuniztions  Flu shot- Every fall  Pneumonia shot- Once after the age of 56; if you are younger ask your healthcare provider if you need a pneumonia shot.  Tetanus- Every ten years.  Zostavax- Once after the age of 93 to prevent shingles.  Take these steps  Don't smoke- Your healthcare provider can help you quit. For tips on how to quit, ask your healthcare provider or go to www.smokefree.gov or call 1-800 QUIT-NOW.  Be physically  active- Exercise 5 days a week for a minimum of 30 minutes.  If you are not already physically active, start slow and gradually work up to 30 minutes of moderate physical activity.  Try walking, dancing, bike riding, swimming, etc.  Eat a healthy diet- Eat a variety of healthy foods such as fruits, vegetables, whole grains, low fat milk, low fat cheeses, yogurt, lean meats, chicken, fish, eggs, dried beans, tofu, etc.  For more information go to www.thenutritionsource.org  Dental visit- Brush and floss teeth twice daily; visit your dentist twice a year.  Eye exam- Visit your Optometrist or Ophthalmologist yearly.  Drink alcohol in moderation- Limit alcohol intake to one drink or less a day.  Never drink and drive.  Depression- Your emotional health is as important as your physical health.  If you're feeling down or losing interest in things you normally enjoy, please talk to your healthcare provider.  Seat Belts- can save your life; always wear one  Smoke/Carbon Monoxide detectors- These detectors need to be installed on the appropriate level of your home.  Replace batteries at least once a year.  Violence-  If anyone is threatening or hurting you, please tell your healthcare provider.  Living Will/ Health care power of attorney- Discuss with your healthcare provider and family.   If you have lab work done today you will be contacted with your lab results within the next 2 weeks.  If you have not heard from [place] then please contact us. The fastest way to get your results is to register for My Chart.   IF you received an x-ray today, you will receive an invoice from [facility]. Please contact [facility] at [phone] with questions or concerns regarding your invoice.   IF you received labwork today, you will receive an invoice from [facility]. Please contact [facility] at [phone] with questions or concerns regarding your invoice.   Our billing staff will not be able to  assist you with questions regarding bills from these companies.  You will be contacted with the lab results as soon as they are available. The fastest way to get your results is to activate your My Chart account. Instructions are located on the last page of this paperwork. If you have not heard from [place] regarding the results in 2 weeks, please contact this office.         Signed, Merri Ray, MD [facility]

## 2019-10-17 NOTE — Patient Instructions (Addendum)
I do recommend colon cancer screening - let me know if I can help order that test.   No change in thyroid medicine for now.  If levels are off, we can repeat that as a lab only visit in the next 6 weeks or so.  Keep up the good work with exercise.  See other recommendations below.  Let me know if you have questions on vaccines we discussed today.  Take care.    Keeping You Healthy  Get These Tests  Blood Pressure- Have your blood pressure checked by your healthcare provider at least once a year.  Normal blood pressure is 120/80.  Weight- Have your body mass index (BMI) calculated to screen for obesity.  BMI is a measure of body fat based on height and weight.  You can calculate your own BMI at [employer].it  Cholesterol- Have your cholesterol checked every year.  Diabetes- Have your blood sugar checked every year if you have high blood pressure, high cholesterol, a family history of diabetes or if you are overweight.  Pap Test - Have a pap test every 1 to 5 years if you have been sexually active.  If you are older than 65 and recent pap tests have been normal you may not need additional pap tests.  In addition, if you have had a hysterectomy  for benign disease additional pap tests are not necessary.  Mammogram-Yearly mammograms are essential for early detection of breast cancer  Screening for Colon Cancer- Colonoscopy starting at age 28. Screening may begin sooner depending on your family history and other health conditions.  Follow up colonoscopy as directed by your Gastroenterologist.  Screening for Osteoporosis- Screening begins at age 74 with bone density scanning, sooner if you are at higher risk for developing Osteoporosis.  Get these medicines  Calcium with Vitamin D- Your body requires 1200-1500 mg of Calcium a day and [phone] IU of Vitamin D a day.  You can only absorb 500 mg of Calcium at a time therefore Calcium must be taken in 2 or 3 separate doses throughout  the day.  Hormones- Hormone therapy has been associated with increased risk for certain cancers and heart disease.  Talk to your healthcare provider about if you need relief from menopausal symptoms.  Aspirin- Ask your healthcare provider about taking Aspirin to prevent Heart Disease and Stroke.  Get these Immuniztions  Flu shot- Every fall  Pneumonia shot- Once after the age of 70; if you are younger ask your healthcare provider if you need a pneumonia shot.  Tetanus- Every ten years.  Zostavax- Once after the age of 37 to prevent shingles.  Take these steps  Don't smoke- Your healthcare provider can help you quit. For tips on how to quit, ask your healthcare provider or go to www.smokefree.gov or call 1-800 QUIT-NOW.  Be physically active- Exercise 5 days a week for a minimum of 30 minutes.  If you are not already physically active, start slow and gradually work up to 30 minutes of moderate physical activity.  Try walking, dancing, bike riding, swimming, etc.  Eat a healthy diet- Eat a variety of healthy foods such as fruits, vegetables, whole grains, low fat milk, low fat cheeses, yogurt, lean meats, chicken, fish, eggs, dried beans, tofu, etc.  For more information go to www.thenutritionsource.org  Dental visit- Brush and floss teeth twice daily; visit your dentist twice a year.  Eye exam- Visit your Optometrist or Ophthalmologist yearly.  Drink alcohol in moderation- Limit alcohol intake to one  drink or less a day.  Never drink and drive.  Depression- Your emotional health is as important as your physical health.  If you're feeling down or losing interest in things you normally enjoy, please talk to your healthcare provider.  Seat Belts- can save your life; always wear one  Smoke/Carbon Monoxide detectors- These detectors need to be installed on the appropriate level of your home.  Replace batteries at least once a year.  Violence- If anyone is threatening or hurting you,  please tell your healthcare provider.  Living Will/ Health care power of attorney- Discuss with your healthcare provider and family.   If you have lab work done today you will be contacted with your lab results within the next 2 weeks.  If you have not heard from [place] then please contact us. The fastest way to get your results is to register for My Chart.   IF you received an x-ray today, you will receive an invoice from [facility]. Please contact [facility] at [phone] with questions or concerns regarding your invoice.   IF you received labwork today, you will receive an invoice from [facility]. Please contact [facility] at [phone] with questions or concerns regarding your invoice.   Our billing staff will not be able to assist you with questions regarding bills from these companies.  You will be contacted with the lab results as soon as they are available. The fastest way to get your results is to activate your My Chart account. Instructions are located on the last page of this paperwork. If you have not heard from [place] regarding the results in 2 weeks, please contact this office.

## 2019-10-18 ENCOUNTER — Encounter: Payer: Self-pay | Admitting: Family Medicine

## 2019-10-18 LAB — CBC WITH DIFFERENTIAL/PLATELET
Basophils Absolute: 0 10*3/uL (ref 0.0–0.2)
Basos: 1 %
EOS (ABSOLUTE): 0.1 10*3/uL (ref 0.0–0.4)
Eos: 1 %
Hematocrit: 40.1 % (ref 34.0–46.6)
Hemoglobin: 14 g/dL (ref 11.1–15.9)
Immature Grans (Abs): 0 10*3/uL (ref 0.0–0.1)
Immature Granulocytes: 0 %
Lymphocytes Absolute: 0.8 10*3/uL (ref 0.7–3.1)
Lymphs: 18 %
MCH: 33.1 pg — ABNORMAL HIGH (ref 26.6–33.0)
MCHC: 34.9 g/dL (ref 31.5–35.7)
MCV: 95 fL (ref 79–97)
Monocytes Absolute: 0.3 10*3/uL (ref 0.1–0.9)
Monocytes: 6 %
Neutrophils Absolute: 3.2 10*3/uL (ref 1.4–7.0)
Neutrophils: 74 %
Platelets: 168 10*3/uL (ref 150–450)
RBC: 4.23 x10E6/uL (ref 3.77–5.28)
RDW: 12.5 % (ref 11.7–15.4)
WBC: 4.3 10*3/uL (ref 3.4–10.8)

## 2019-10-18 LAB — COMPREHENSIVE METABOLIC PANEL
ALT: 14 IU/L (ref 0–32)
AST: 22 IU/L (ref 0–40)
Albumin/Globulin Ratio: 2.3 — ABNORMAL HIGH (ref 1.2–2.2)
Albumin: 4.9 g/dL (ref 3.8–4.9)
Alkaline Phosphatase: 55 IU/L (ref 48–121)
BUN/Creatinine Ratio: 20 (ref 9–23)
BUN: 16 mg/dL (ref 6–24)
Bilirubin Total: 0.7 mg/dL (ref 0.0–1.2)
CO2: 24 mmol/L (ref 20–29)
Calcium: 9.5 mg/dL (ref 8.7–10.2)
Chloride: 102 mmol/L (ref 96–106)
Creatinine, Ser: 0.8 mg/dL (ref 0.57–1.00)
GFR calc Af Amer: 99 mL/min/{1.73_m2} (ref >59)
GFR calc non Af Amer: 86 mL/min/{1.73_m2} (ref >59)
Globulin, Total: 2.1 g/dL (ref 1.5–4.5)
Glucose: 106 mg/dL — ABNORMAL HIGH (ref 65–99)
Potassium: 5 mmol/L (ref 3.5–5.2)
Sodium: 140 mmol/L (ref 134–144)
Total Protein: 7 g/dL (ref 6.0–8.5)

## 2019-10-18 LAB — LIPID PANEL
Chol/HDL Ratio: 2.8 ratio (ref 0.0–4.4)
Cholesterol, Total: 168 mg/dL (ref 100–199)
HDL: 61 mg/dL (ref >39)
LDL Chol Calc (NIH): 92 mg/dL (ref 0–99)
Triglycerides: 79 mg/dL (ref 0–149)
VLDL Cholesterol Cal: 15 mg/dL (ref 5–40)

## 2019-10-18 LAB — TSH: TSH: 15.6 u[IU]/mL — ABNORMAL HIGH (ref 0.450–4.500)

## 2019-10-18 LAB — HEMOGLOBIN A1C
Est. average glucose Bld gHb Est-mCnc: 91 mg/dL
Hgb A1c MFr Bld: 4.8 % (ref 4.8–5.6)

## 2019-10-28 ENCOUNTER — Other Ambulatory Visit: Payer: Self-pay | Admitting: Family Medicine

## 2019-10-28 DIAGNOSIS — E039 Hypothyroidism, unspecified: Secondary | ICD-10-CM

## 2019-11-27 IMAGING — US ULTRASOUND LEFT BREAST LIMITED
1 series · 11 of 11 positions shown · non-contrast
Comparison: Previous exam(s).

CLINICAL DATA: Patient presents for bilateral diagnostic
examination due to a palpable abnormality for 1 month over the inner
upper left breast.

EXAM:
DIGITAL DIAGNOSTIC bilateral MAMMOGRAM WITH CAD AND TOMO
ULTRASOUND left BREAST

[Series 1: ultrasound left breast limited · 0.07mm/px · 11 of 11 slices shown]
[im 1/11]
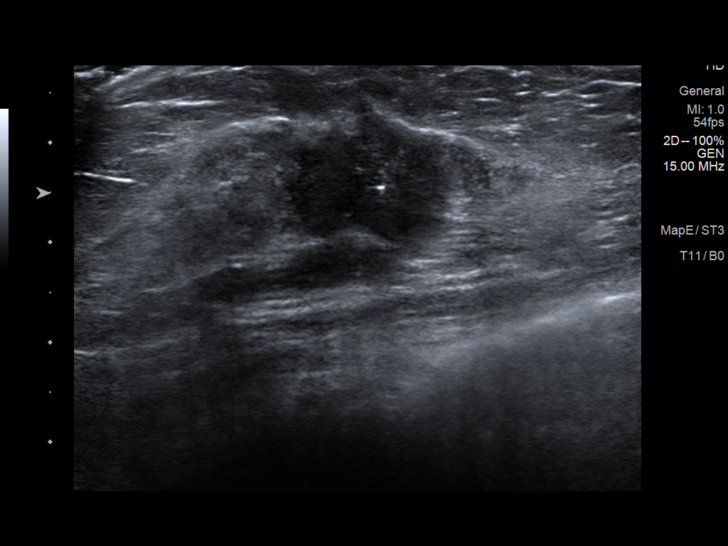
[im 2/11]
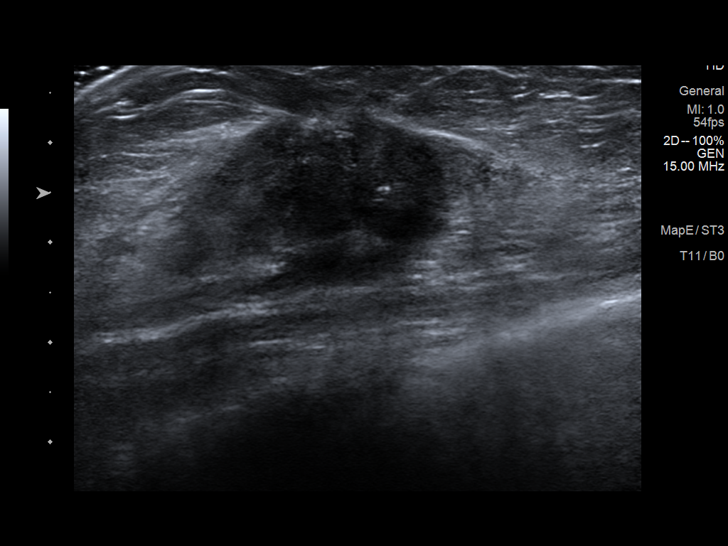
[im 3/11]
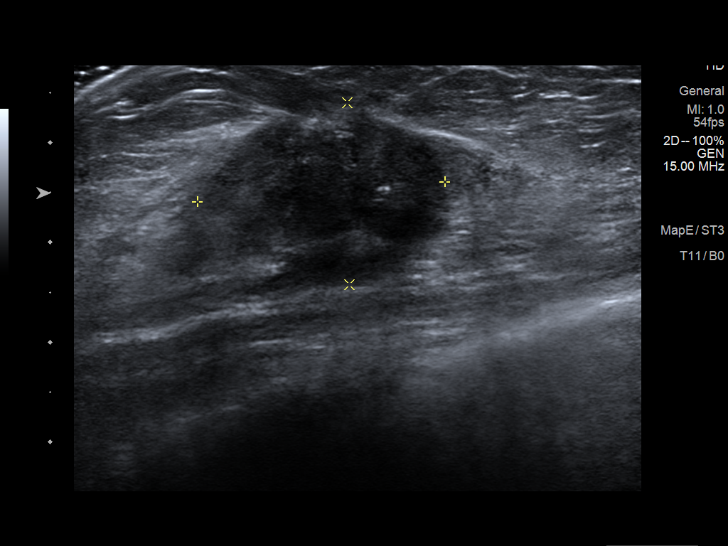
[im 4/11]
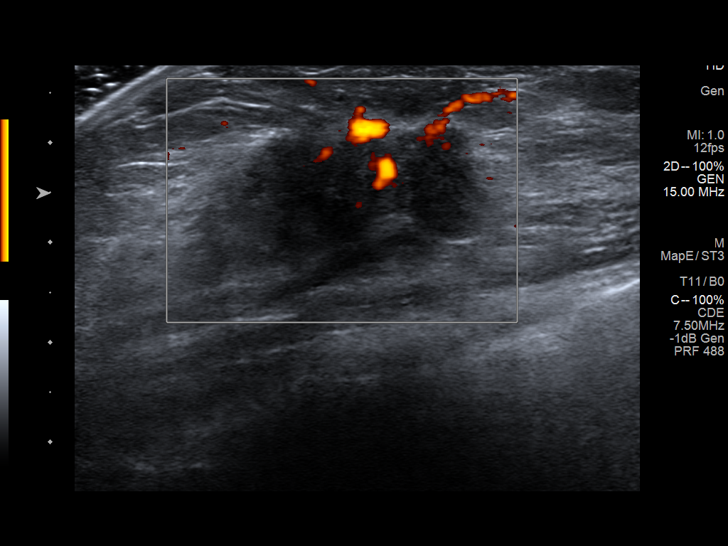
[im 5/11]
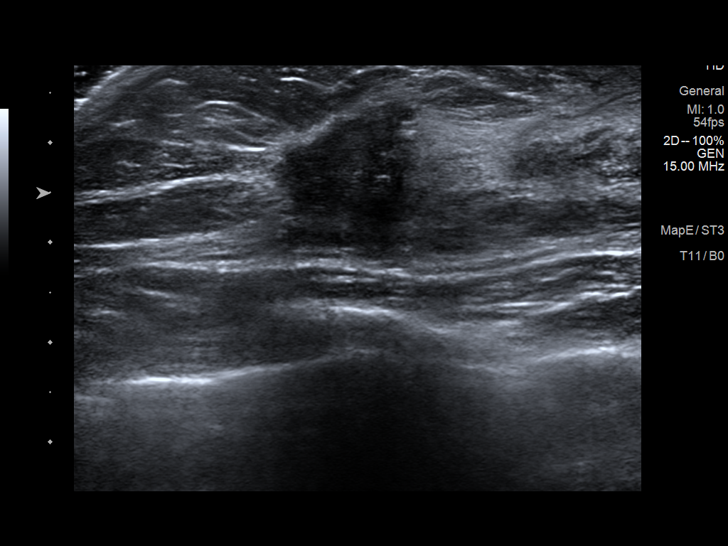
[im 6/11]
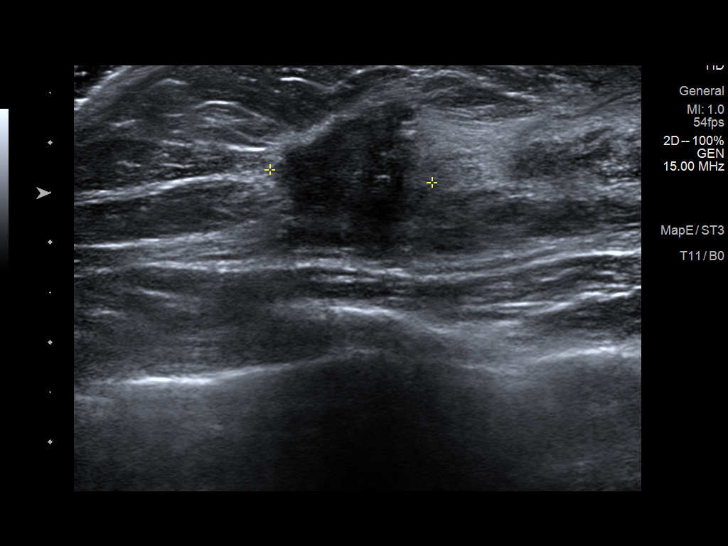
[im 7/11]
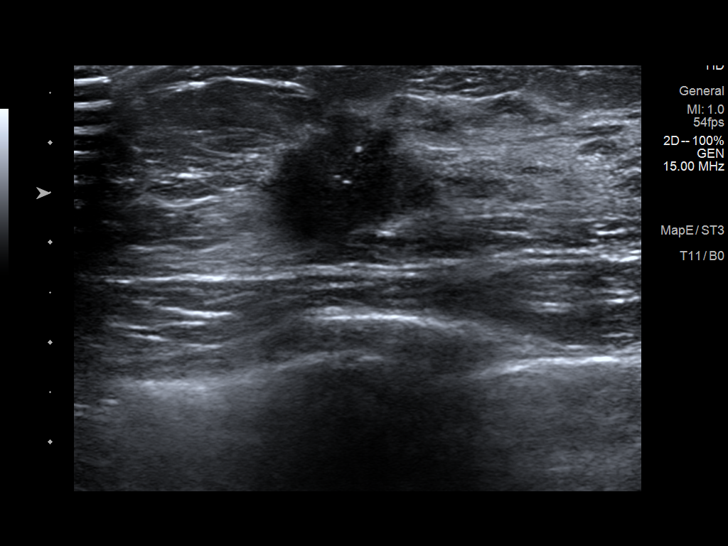
[im 8/11]
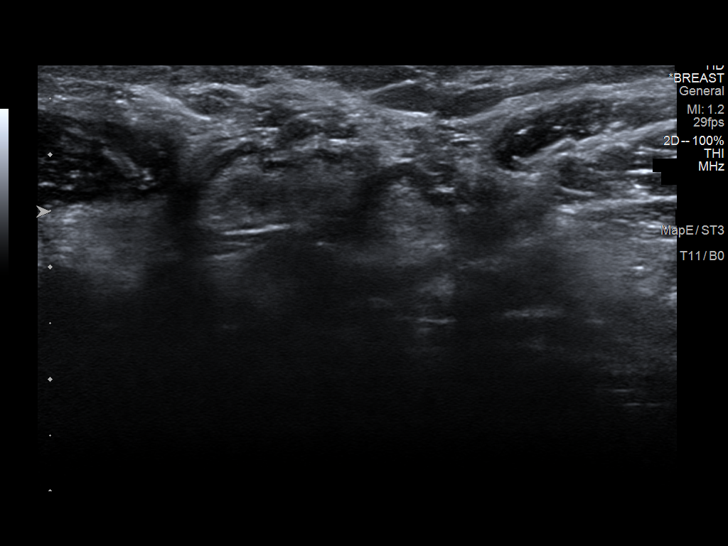
[im 9/11]
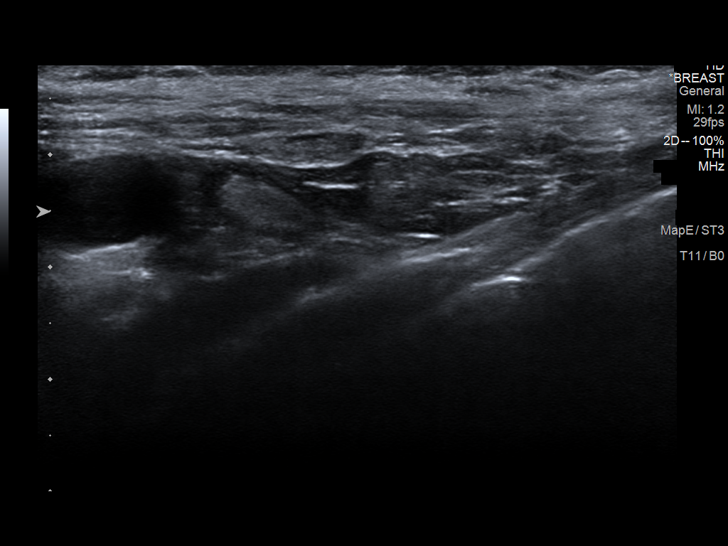
[im 10/11]
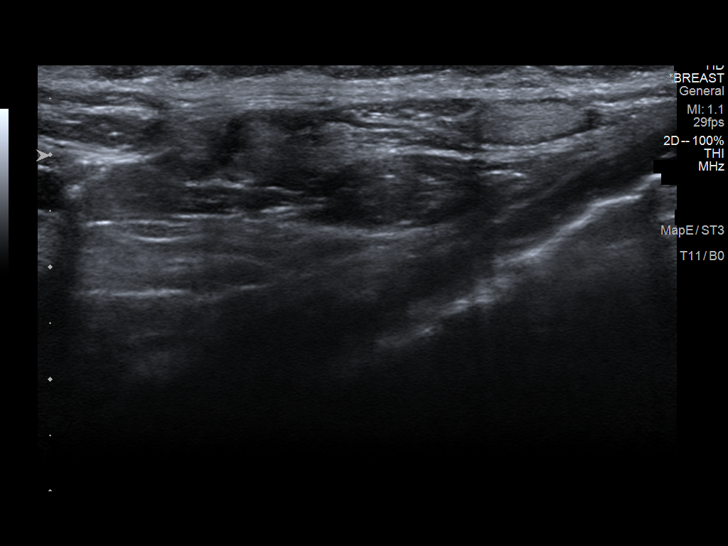
[im 11/11]
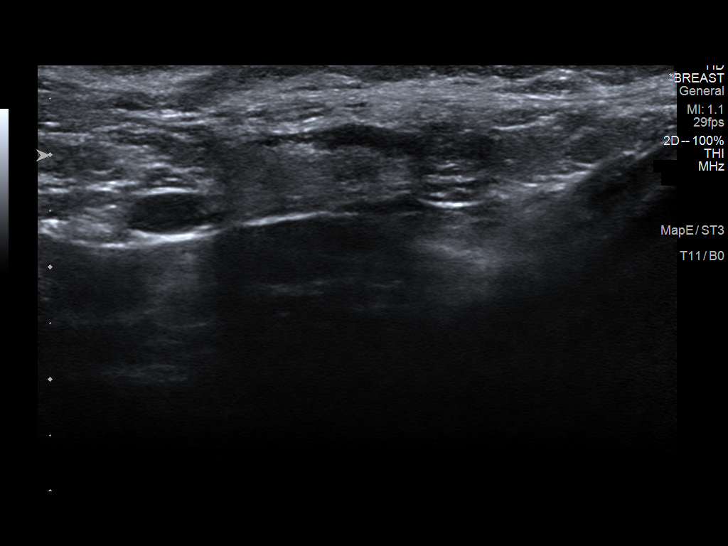

[11 of 11 positions shown; findings below may reference images not displayed]

ACR Breast Density Category c: The breast tissue is heterogeneously
dense, which may obscure small masses.
FINDINGS: Examination demonstrates a 2 cm spiculated mass with a few
associated microcalcifications over the inner upper left breast.
Suggestion of subtle nipple retraction and skin thickening in the
left periareolar region. There are a few microcalcifications just
anteromedial and inferior to the mass. Remainder of the exam is
unremarkable.

Mammographic images were processed with CAD.

Targeted ultrasound is performed, showing a hypoechoic mass with
irregular shape and margins at the 10 o'clock position of the left
breast 3 cm from the nipple measuring 1.6 x 1.8 x 2.5 cm. There are
several internal microcalcifications present.

Ultrasound of the left axilla demonstrates no abnormal lymph nodes.
IMPRESSION: Suspicious mass 10 o'clock position left breast 3 cm from the nipple
measuring 1.6 x 1.8 x 2.5 cm accounting for patient's palpable
abnormality.

RECOMMENDATION:
Recommend ultrasound-guided core needle biopsy for further
evaluation.

I have discussed the findings and recommendations with the patient.
Results were also provided in writing at the conclusion of the
visit. If applicable, a reminder letter will be sent to the patient
regarding the next appointment.

BI-RADS CATEGORY  5: Highly suggestive of malignancy.

Biopsy to be scheduled here at the [REDACTED] prior to patient's
departure.

## 2020-01-03 IMAGING — US ULTRASOUND RIGHT BREAST LIMITED
1 series · 5 of 5 positions shown · non-contrast
Comparison: Previous exam(s).

CLINICAL DATA: Patient has a recently biopsied left breast
carcinoma. Staging breast MRI demonstrated a 7 mm enhancing mass in
the posterior lower inner right breast for which second-look
ultrasound and possible biopsy was recommended.

EXAM:
ULTRASOUND OF THE LEFT BREAST

[Series 1: ultrasound right breast limited · 0.05mm/px · 5 of 5 slices shown]
[im 1/5]
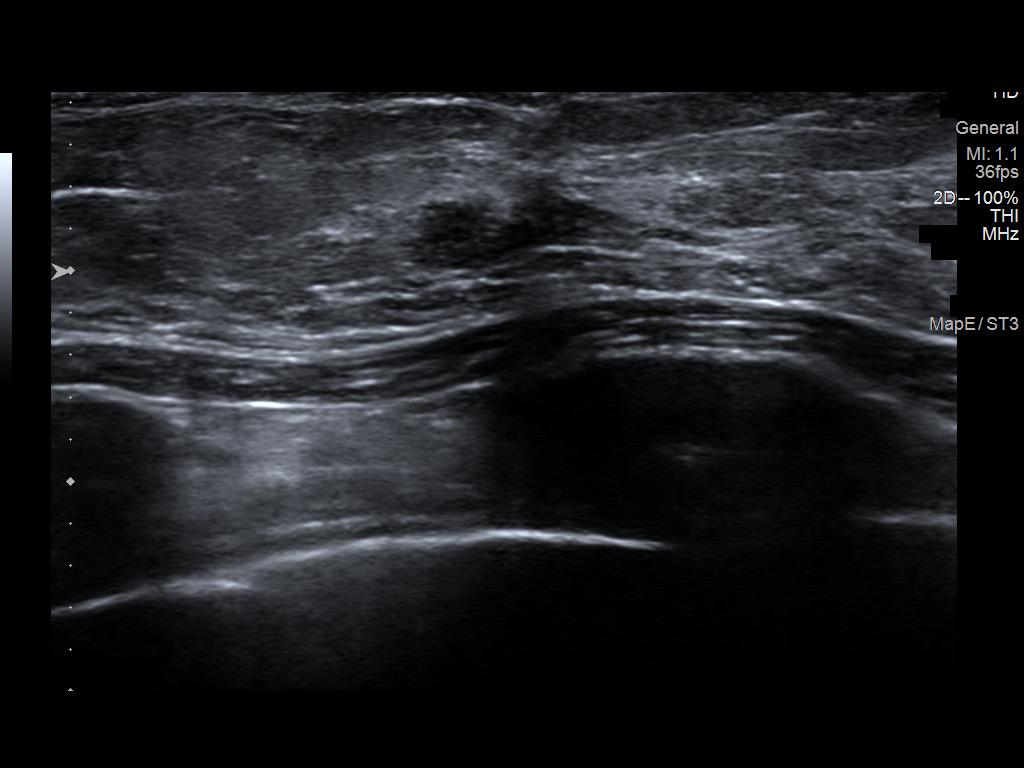
[im 2/5]
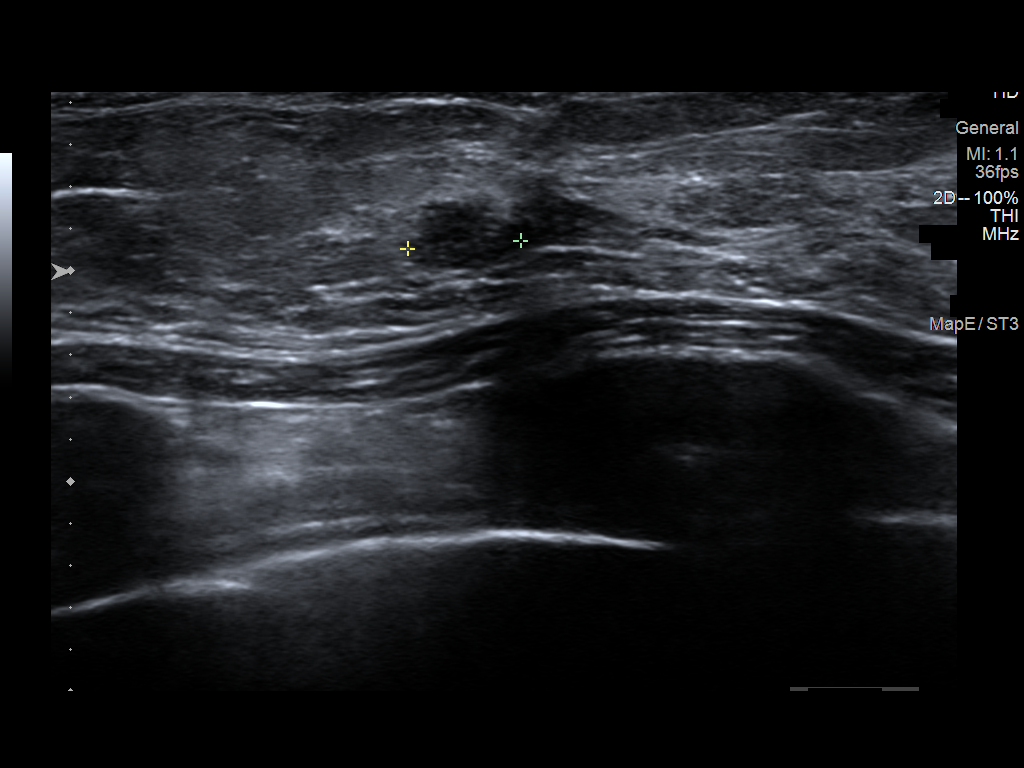
[im 3/5]
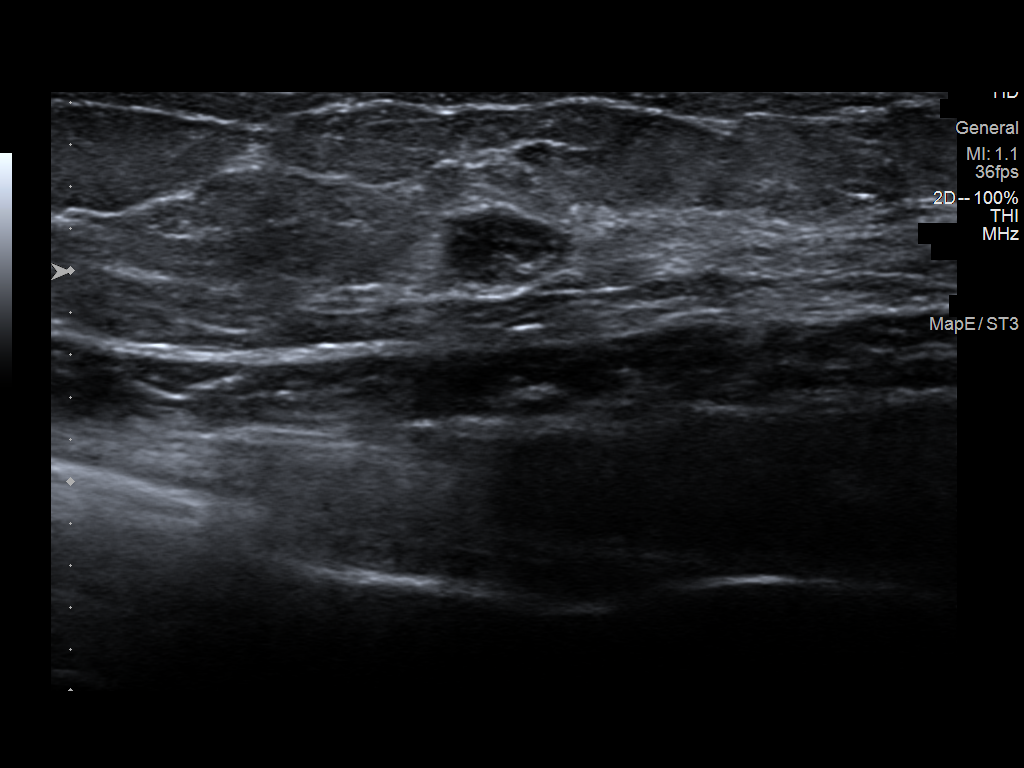
[im 4/5]
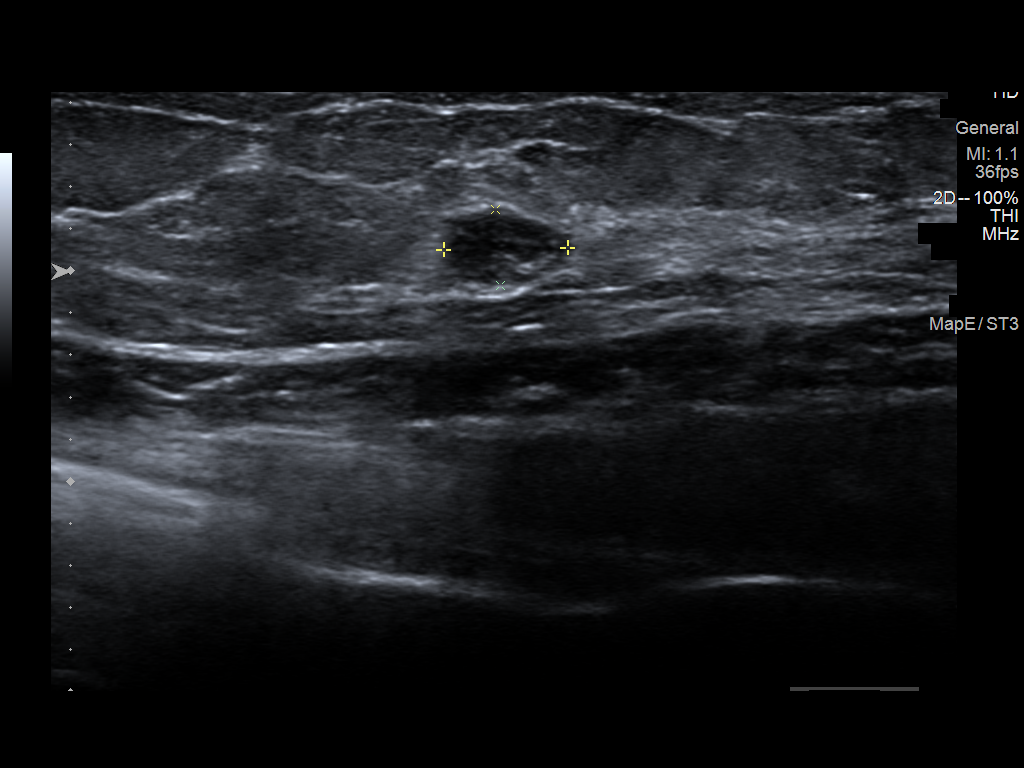
[im 5/5]
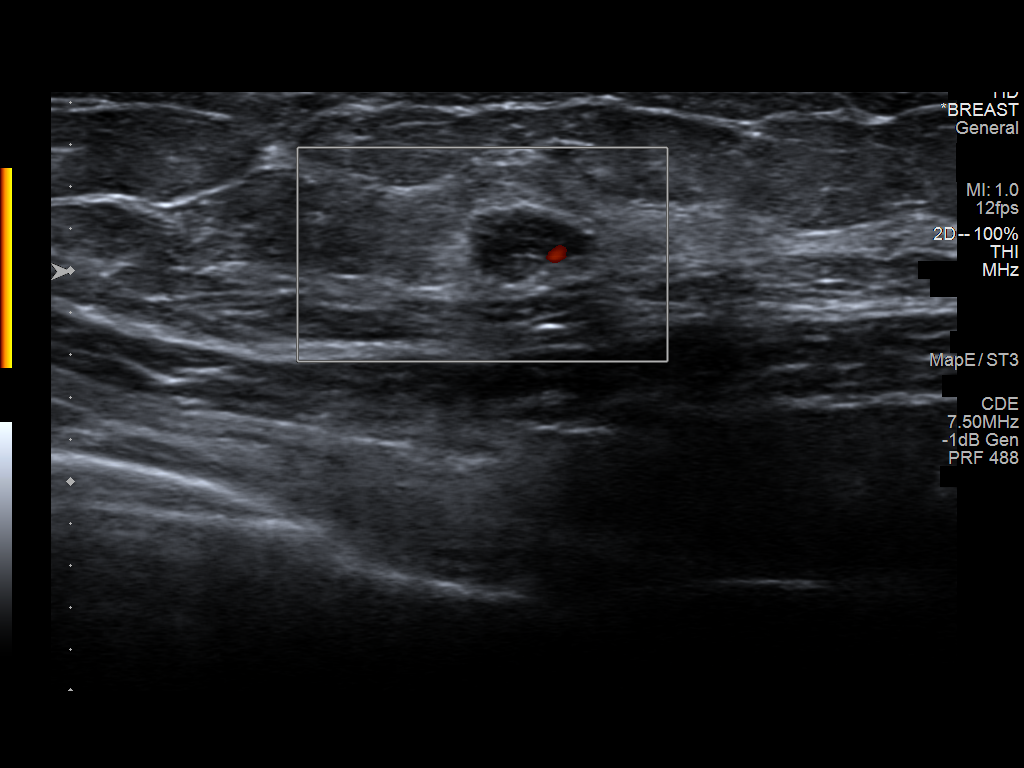

[5 of 5 positions shown; findings below may reference images not displayed]

FINDINGS: On physical exam, no masses palpated in the lower inner aspect of
the right breast.

Targeted ultrasound is performed, showing a small hypoechoic mass
with partly ill-defined margins in the posterior lower inner
quadrant of the right breast at 4 o'clock, 5 cm the nipple, which
corresponds in size and location to the MRI abnormality. Mass
measures 6 x 4 x 5 mm. Some internal blood flow was noted on color
Doppler analysis.
IMPRESSION: 1. Suspicious 6 mm mass in the posterior, lower inner quadrant of
the right breast, which corresponds to the enhancing mass seen on
the recent staging breast MRI.

RECOMMENDATION:
1. Ultrasound-guided core needle biopsy of this small mass will be
performed today.

I have discussed the findings and recommendations with the patient.
Results were also provided in writing at the conclusion of the
visit. If applicable, a reminder letter will be sent to the patient
regarding the next appointment.

BI-RADS CATEGORY  4: Suspicious.

## 2020-01-03 IMAGING — MG MM CLIP PLACEMENT
3 series · 3 of 3 positions shown · non-contrast
Comparison: Previous exam(s).

CLINICAL DATA: Status post ultrasound-guided core needle biopsy of
a right breast mass, initially seen as an enhancing mass on a
staging breast MRI. Patient has known left breast carcinoma.

EXAM:
DIAGNOSTIC RIGHT MAMMOGRAM POST ULTRASOUND BIOPSY

[R MLO]
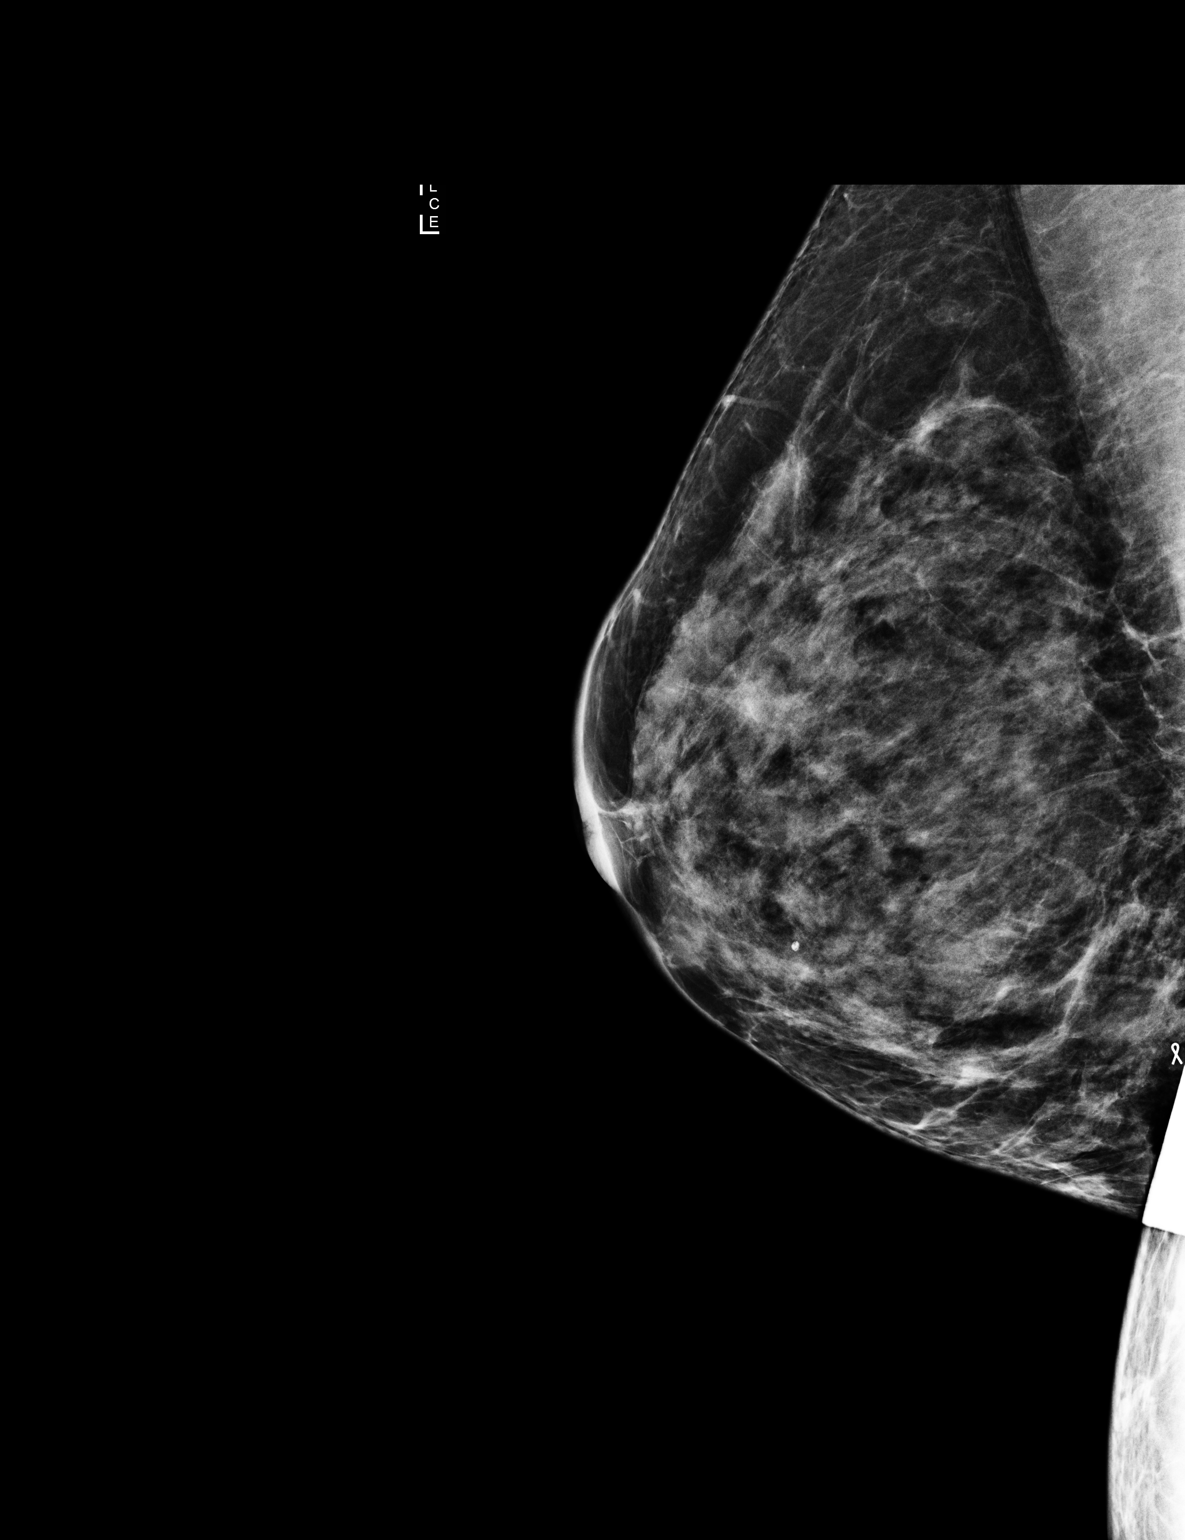

[R CC]
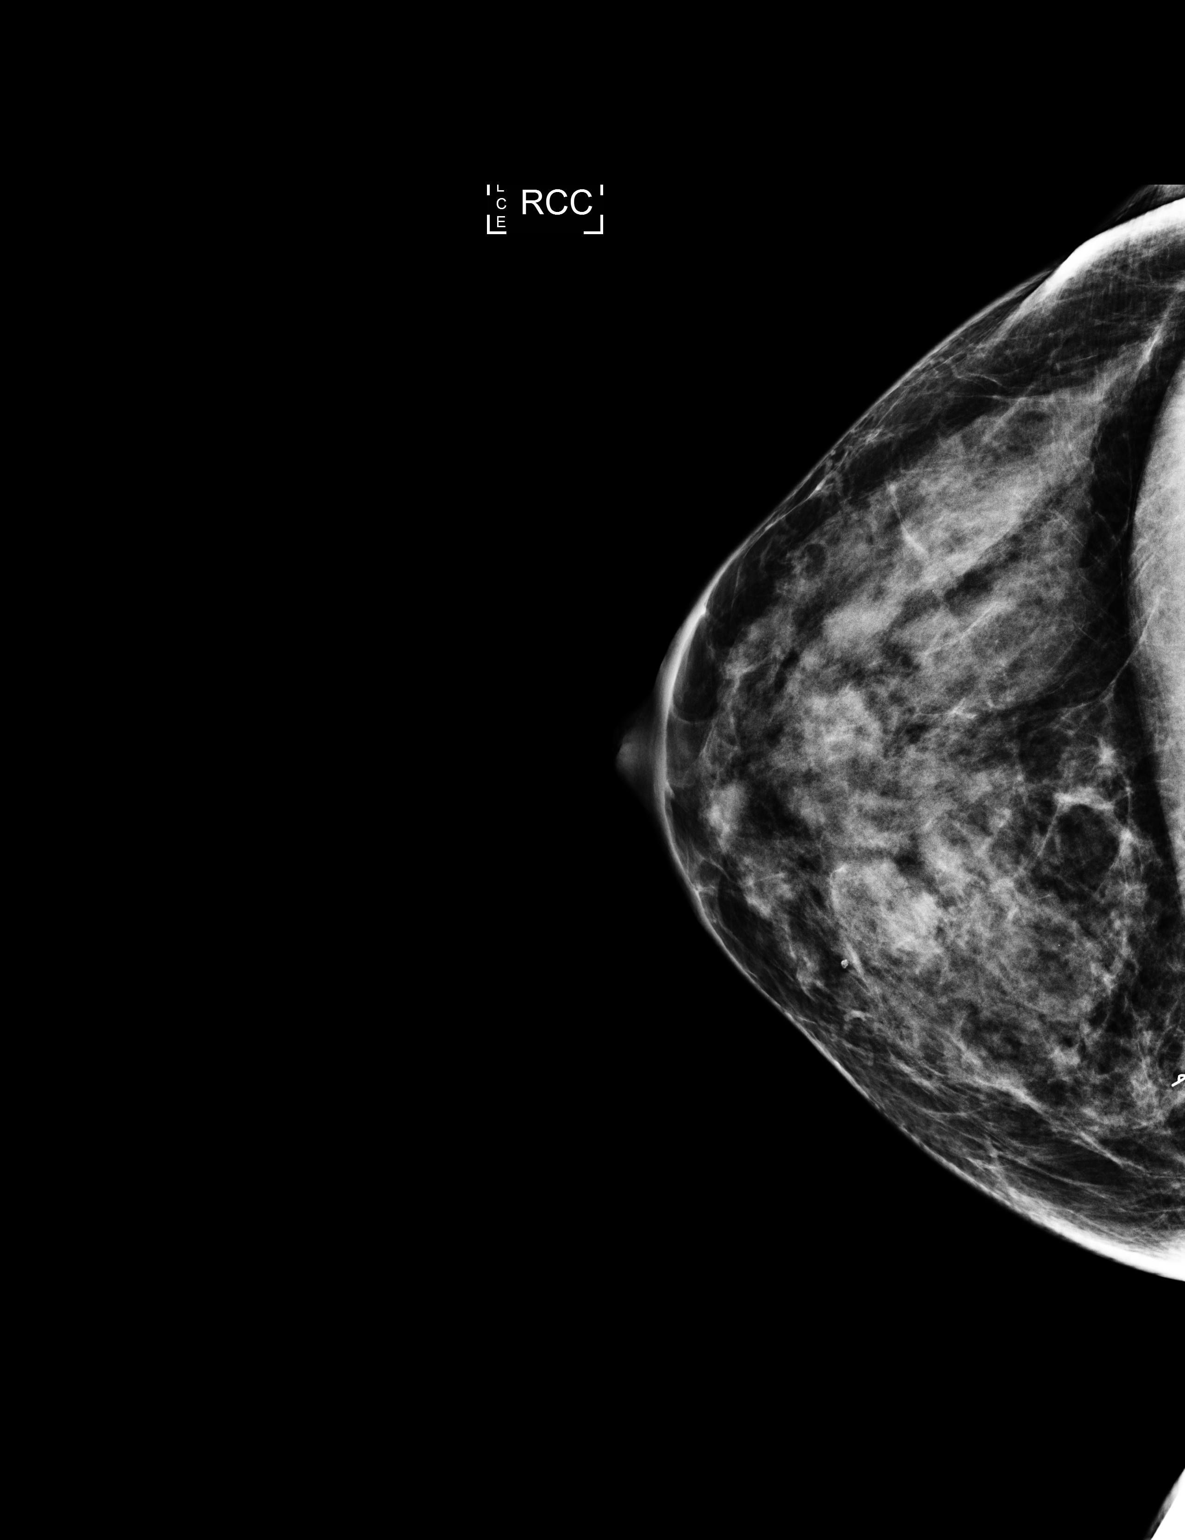

[R LM]
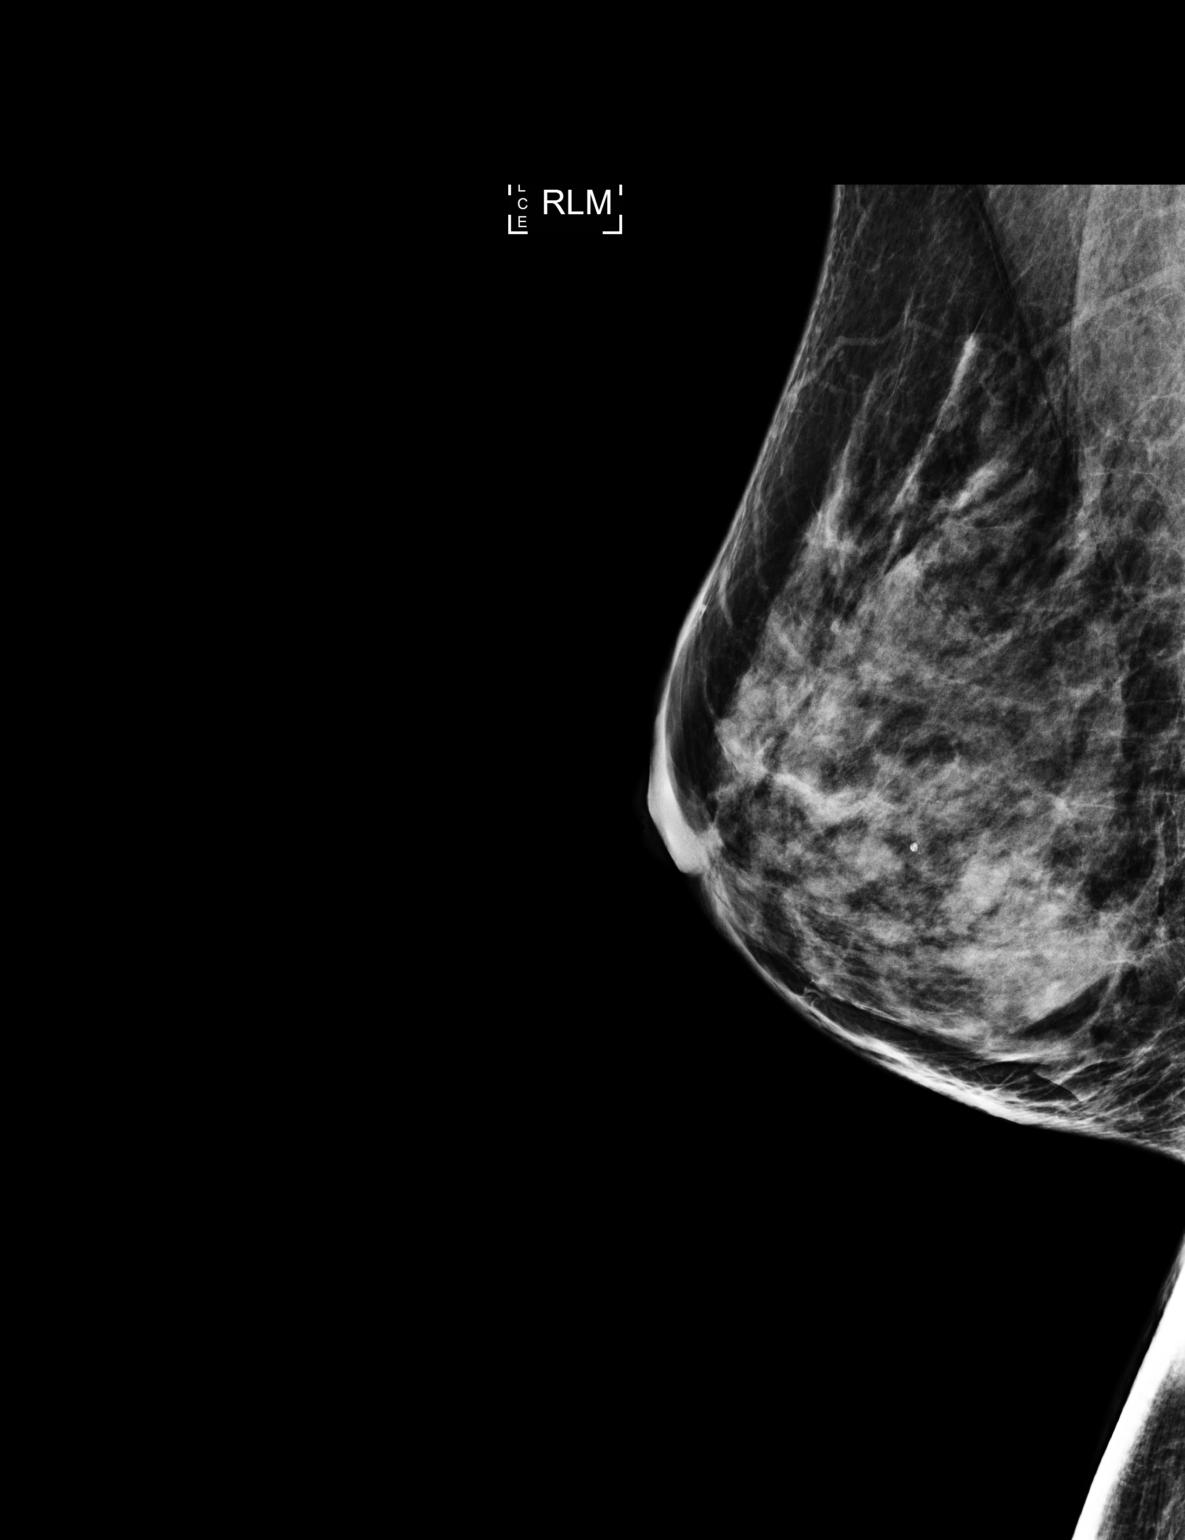

[3 of 3 positions shown; findings below may reference images not displayed]

FINDINGS: Mammographic images were obtained following ultrasound guided biopsy
of a 6 mm posterior lower inner quadrant right breast mass. The
ribbon shaped biopsy clip lies in the expected location of the mass.
IMPRESSION: Well-positioned ribbon shaped biopsy clip following
ultrasound-guided core needle biopsy of a right breast mass.

Final Assessment: Post Procedure Mammograms for Marker Placement

## 2020-02-17 ENCOUNTER — Telehealth: Payer: Self-pay | Admitting: Hematology and Oncology

## 2020-02-17 NOTE — Telephone Encounter (Signed)
Attempted to contact patient to reschedule appt. Due to provider PAL. Phone call could not go through with the number provided.

## 2020-02-18 NOTE — Telephone Encounter (Signed)
Unable to speak to patient regarding appointment reschedule due to provider PAL. Rescheduled appointment and mailed schedule to patient.

## 2020-03-22 NOTE — Progress Notes (Signed)
Patient Care Team: Patient, No Pcp Per as PCP - General (General Practice) Serena Croissant, MD as Consulting Physician (Hematology and Oncology) Candice Camp, MD as Consulting Physician (Obstetrics and Gynecology) Dorothy Puffer, MD as Consulting Physician (Radiation Oncology) Griselda Miner, MD as Consulting Physician (General Surgery) Loa Socks, NP as Nurse Practitioner (Hematology and Oncology)  DIAGNOSIS:    ICD-10-CM   1. Malignant neoplasm of upper-inner quadrant of left breast in female, estrogen receptor positive (HCC)  C50.212    Z17.0     SUMMARY OF ONCOLOGIC HISTORY: Oncology History  Malignant neoplasm of upper-inner quadrant of left breast in female, estrogen receptor positive (HCC)  07/09/2017 Initial Diagnosis   Palpable mass left breast 3 cm from the nipple measuring 1.6 x 1.8 x 2.5 cm, biopsy revealed grade 2 IDC ER 95%, PR 90%, Ki-67 15%, HER-2 negative ratio 1.26, T2 N0 stage Ib clinical stage AJCC 8   07/27/2017 Genetic Testing   CHEK2 p.M381V VUS identified on the CancerNext panel.  The CancerNext gene panel offered by [employer] includes sequencing and rearrangement analysis for the following 34 genes:   APC, ATM, BARD1, BMPR1A, BRCA1, BRCA2, BRIP1, CDH1, CDK4, CDKN2A, CHEK2, DICER1, HOXB13, EPCAM, GREM1, MLH1, MRE11A, MSH2, MSH6, MUTYH, NBN, NF1, PALB2, PMS2, POLD1, POLE, PTEN, RAD50, RAD51C, RAD51D, SMAD4, SMARCA4, STK11, and TP53.  The report date is Jul 27, 2017.   08/27/2017 Surgery   Left lumpectomy: IDC grade 2, 2.2 cm, low-grade DCIS, lymphovascular invasion identified, margins negative, 1 lymph node with micrometastatic disease, 3 additional lymph nodes negative, ER 95%, PR 90%, Ki-67 15%, HER-2 negative ratio 1.26, T2N1 mic stage Ib   08/27/2017 Oncotype testing   17/5%   09/03/2017 Cancer Staging   Staging form: Breast, AJCC 8th Edition - Pathologic: Stage IB (pT2, pN21mi, cM0, G2, ER+, PR+, HER2-) - Signed by Serena Croissant, MD on 09/03/2017    10/15/2017 - 11/19/2017 Radiation Therapy   Adjuvant XRT   12/17/2017 -  Anti-estrogen oral therapy   Tamoxifen 20mg  daily     CHIEF COMPLIANT: Follow-up of left breast cancer on tamoxifen therapy  INTERVAL HISTORY: Gloria Kramer is a 55 y.o. with above-mentioned history of left breast cancer who underwent a lumpectomy, radiation, and is currently on antiestrogen therapy with tamoxifen. Mammogram on 07/09/19 showed no evidence of malignancy bilaterally. She presents to the clinicalonetoday for follow-up. She did not do the breast MRI because of $1000 deductible. She is complaining that the left breast appears to be more swollen than usual.  ALLERGIES:  is allergic to penicillins.  MEDICATIONS:  Current Outpatient Medications  Medication Sig Dispense Refill   b complex vitamins capsule Take 1 capsule by mouth daily.     levothyroxine (SYNTHROID) 112 MCG tablet Take 1 tablet (112 mcg total) by mouth daily before breakfast. 90 tablet 3   Polysaccharide Iron Complex (NOVAFERRUM 50) 50 MG CAPS Take 50 mg by mouth daily. 90 capsule    tamoxifen (NOLVADEX) 20 MG tablet TAKE 1 TABLET DAILY 90 tablet 0   No current facility-administered medications for this visit.    PHYSICAL EXAMINATION: ECOG PERFORMANCE STATUS: 1 - Symptomatic but completely ambulatory  Vitals:   03/23/20 1046  BP: 132/80  Pulse: 93  Resp: 17  Temp: 97.9 F (36.6 C)  SpO2: 100%   Filed Weights   03/23/20 1046  Weight: 132 lb 12.8 oz (60.2 kg)    BREAST: No palpable masses or nodules in either right or left breasts. No palpable axillary supraclavicular  or infraclavicular adenopathy no breast tenderness or nipple discharge. (exam performed in the presence of a chaperone)  LABORATORY DATA:  I have reviewed the data as listed CMP Latest Ref Rng & Units 10/17/2019 05/07/2018 01/25/2017  Glucose 65 - 99 mg/dL 106(H) 94 -  BUN 6 - 24 mg/dL $Remove'16 12 12  '[id number]$ Creatinine 0.57 - 1.00 mg/dL 0.80 0.80 0.6  Sodium 134 - 144  mmol/L 140 142 140  Potassium 3.5 - 5.2 mmol/L 5.0 4.0 4.6  Chloride 96 - 106 mmol/L 102 106 -  CO2 20 - 29 mmol/L 24 27 -  Calcium 8.7 - 10.2 mg/dL 9.5 8.8(L) -  Total Protein 6.0 - 8.5 g/dL 7.0 6.6 -  Total Bilirubin 0.0 - 1.2 mg/dL 0.7 0.6 -  Alkaline Phos 48 - 121 IU/L 55 46 -  AST 0 - 40 IU/L 22 21 -  ALT 0 - 32 IU/L 14 12 -    Lab Results  Component Value Date   WBC 4.3 10/17/2019   HGB 14.0 10/17/2019   HCT 40.1 10/17/2019   MCV 95 10/17/2019   PLT 168 10/17/2019   NEUTROABS 3.2 10/17/2019    ASSESSMENT & PLAN:  Malignant neoplasm of upper-inner quadrant of left breast in female, estrogen receptor positive ([place]) 08/27/2017:Left lumpectomy: IDC grade 2, 2.2 cm, low-grade DCIS, lymphovascular invasion identified, margins negative, 1 lymph node with micrometastatic disease, 3 additional lymph nodes negative, ER 95%, PR 90%, Ki-67 15%, HER-2 negative ratio 1.26, T2N1 mic stage Ib Oncotype DX recurrence score 17: Risk of distant recurrence 5% Adjuvant radiation therapy 10/13/2017 to 11/19/2017  Treatment plan: Oral antiestrogen therapy with tamoxifen 20 mg daily x10 years She had 1 cycle in November and her gynecologist is monitoring and she is not menopausal yet.  Tamoxifen toxicities: Hot flashes  Patient is exercising frequently and has been eating very healthy. COVID-19 infection in November 2021: She lost significant weight and was sick for 3 weeks  Breast cancer surveillance: 1.Breast exam 03/27/2019: Benign 2.Mammogram 07/09/2019: Postlumpectomy changes, benign, breast density category C 3.  Breast MRI 02/17/2019: Indeterminate 6 mm enhancing mass central left breast, indeterminate enhancement behind the left nipple, numerous foci of clumped non-mass enhancement throughout the right breast.  Slight breast swelling: I do not feel any abnormalities at this time.  The recommendation was to get a breast MRI in July 2021.  However it was not done because she has $1000  deductible.  We decided to not perform any further MRIs unless it is absolutely necessary based upon the mammograms.  Right breast biopsies 03/06/2019: ADH with fibrocystic changes and benign breast tissue  Return to clinic in 1 year for follow-up   No orders of the defined types were placed in this encounter.  The patient has a good understanding of the overall plan. she agrees with it. she will call with any problems that may develop before the next visit here.  Total time spent: 20 mins including face to face time and time spent for planning, charting and coordination of care  Nicholas Lose, MD 03/23/2020  I, Cloyde Reams Dorshimer, am acting as scribe for Dr. Nicholas Lose.  I have reviewed the above documentation for accuracy and completeness, and I agree with the above.

## 2020-03-23 ENCOUNTER — Inpatient Hospital Stay: Payer: POS | Attending: Hematology and Oncology | Admitting: Hematology and Oncology

## 2020-03-23 ENCOUNTER — Other Ambulatory Visit: Payer: Self-pay

## 2020-03-23 ENCOUNTER — Other Ambulatory Visit: Payer: Self-pay | Admitting: Hematology and Oncology

## 2020-03-23 DIAGNOSIS — Z79810 Long term (current) use of selective estrogen receptor modulators (SERMs): Secondary | ICD-10-CM | POA: Insufficient documentation

## 2020-03-23 DIAGNOSIS — C50212 Malignant neoplasm of upper-inner quadrant of left female breast: Secondary | ICD-10-CM | POA: Insufficient documentation

## 2020-03-23 DIAGNOSIS — Z923 Personal history of irradiation: Secondary | ICD-10-CM | POA: Diagnosis not present

## 2020-03-23 DIAGNOSIS — Z170 Estrogen receptor positive status [ER+]: Secondary | ICD-10-CM | POA: Diagnosis not present

## 2020-03-23 MED ORDER — TAMOXIFEN CITRATE 20 MG PO TABS
20.0000 mg | ORAL_TABLET | Freq: Every day | ORAL | 3 refills | Status: DC
Start: 2020-03-23 — End: 2021-03-22

## 2020-03-23 NOTE — Assessment & Plan Note (Signed)
08/27/2017:Left lumpectomy: IDC grade 2, 2.2 cm, low-grade DCIS, lymphovascular invasion identified, margins negative, 1 lymph node with micrometastatic disease, 3 additional lymph nodes negative, ER 95%, PR 90%, Ki-67 15%, HER-2 negative ratio 1.26, T2N1 mic stage Ib Oncotype DX recurrence score 17: Risk of distant recurrence 5% Adjuvant radiation therapy 10/13/2017 to 11/19/2017  Treatment plan: Oral antiestrogen therapy with tamoxifen 20 mg daily x10 years Menstrual cycles have stopped.  Tamoxifen toxicities: Denies any significant side effects. Denies any significant hot flashes or arthralgias.  Patient is exercising frequently and has been eating very healthy.  Breast cancer surveillance: 1.Breast exam 03/27/2019: Benign 2.Mammogram 07/09/2019: Postlumpectomy changes, benign, breast density category C 3.  Breast MRI 02/17/2019: Indeterminate 6 mm enhancing mass central left breast, indeterminate enhancement behind the left nipple, numerous foci of clumped non-mass enhancement throughout the right breast.  The recommendation was to get a breast MRI in July 2021.  However it was not done.  Right breast biopsies 03/06/2019: ADH with fibrocystic changes and benign breast tissue

## 2020-03-26 ENCOUNTER — Ambulatory Visit: Payer: POS | Admitting: Hematology and Oncology

## 2020-06-07 ENCOUNTER — Other Ambulatory Visit: Payer: Self-pay | Admitting: Hematology and Oncology

## 2020-06-07 DIAGNOSIS — Z9889 Other specified postprocedural states: Secondary | ICD-10-CM

## 2020-07-07 ENCOUNTER — Other Ambulatory Visit: Payer: Self-pay | Admitting: General Surgery

## 2020-07-07 DIAGNOSIS — N6310 Unspecified lump in the right breast, unspecified quadrant: Secondary | ICD-10-CM

## 2020-07-09 ENCOUNTER — Ambulatory Visit
Admission: RE | Admit: 2020-07-09 | Discharge: 2020-07-09 | Disposition: A | Discharge status: Home / Self Care | Payer: POS | Source: Ambulatory Visit | Attending: Hematology and Oncology | Admitting: Hematology and Oncology

## 2020-07-09 ENCOUNTER — Other Ambulatory Visit: Payer: Self-pay

## 2020-07-09 DIAGNOSIS — Z9889 Other specified postprocedural states: Secondary | ICD-10-CM

## 2020-07-22 ENCOUNTER — Other Ambulatory Visit: Payer: Self-pay

## 2020-07-22 ENCOUNTER — Ambulatory Visit
Admission: RE | Admit: 2020-07-22 | Discharge: 2020-07-22 | Disposition: A | Discharge status: Home / Self Care | Payer: POS | Source: Ambulatory Visit | Attending: General Surgery | Admitting: General Surgery

## 2020-07-22 DIAGNOSIS — N6310 Unspecified lump in the right breast, unspecified quadrant: Secondary | ICD-10-CM

## 2020-07-22 MED ORDER — GADOBUTROL 1 MMOL/ML IV SOLN
6.0000 mL | Freq: Once | INTRAVENOUS | Status: AC | PRN
  Administered 2020-07-22: 6 mL via INTRAVENOUS

## 2020-10-11 ENCOUNTER — Other Ambulatory Visit: Payer: Self-pay | Admitting: Family Medicine

## 2020-10-11 DIAGNOSIS — E039 Hypothyroidism, unspecified: Secondary | ICD-10-CM

## 2020-10-19 ENCOUNTER — Encounter (INDEPENDENT_AMBULATORY_CARE_PROVIDER_SITE_OTHER): Payer: Self-pay

## 2020-10-20 ENCOUNTER — Ambulatory Visit (INDEPENDENT_AMBULATORY_CARE_PROVIDER_SITE_OTHER): Payer: POS | Admitting: Family Medicine

## 2020-10-20 ENCOUNTER — Encounter: Payer: Self-pay | Admitting: Family Medicine

## 2020-10-20 ENCOUNTER — Other Ambulatory Visit: Payer: Self-pay

## 2020-10-20 VITALS — BP 132/68 | HR 74 | Temp 98.2°F | Resp 16 | Ht 68.0 in | Wt 140.0 lb

## 2020-10-20 DIAGNOSIS — Z1211 Encounter for screening for malignant neoplasm of colon: Secondary | ICD-10-CM

## 2020-10-20 DIAGNOSIS — Z0000 Encounter for general adult medical examination without abnormal findings: Secondary | ICD-10-CM | POA: Diagnosis not present

## 2020-10-20 DIAGNOSIS — E039 Hypothyroidism, unspecified: Secondary | ICD-10-CM

## 2020-10-20 DIAGNOSIS — Z131 Encounter for screening for diabetes mellitus: Secondary | ICD-10-CM

## 2020-10-20 DIAGNOSIS — Z862 Personal history of diseases of the blood and blood-forming organs and certain disorders involving the immune mechanism: Secondary | ICD-10-CM

## 2020-10-20 DIAGNOSIS — Z13220 Encounter for screening for lipoid disorders: Secondary | ICD-10-CM | POA: Diagnosis not present

## 2020-10-20 DIAGNOSIS — Z23 Encounter for immunization: Secondary | ICD-10-CM | POA: Diagnosis not present

## 2020-10-20 LAB — COMPREHENSIVE METABOLIC PANEL
ALT: 14 U/L (ref 0–35)
AST: 20 U/L (ref 0–37)
Albumin: 4.5 g/dL (ref 3.5–5.2)
Alkaline Phosphatase: 49 U/L (ref 39–117)
BUN: 15 mg/dL (ref 6–23)
CO2: 27 mEq/L (ref 19–32)
Calcium: 9.2 mg/dL (ref 8.4–10.5)
Chloride: 105 mEq/L (ref 96–112)
Creatinine, Ser: 0.7 mg/dL (ref 0.40–1.20)
GFR: 99.19 mL/min (ref >60.00)
Glucose, Bld: 88 mg/dL (ref 70–99)
Potassium: 3.5 mEq/L (ref 3.5–5.1)
Sodium: 140 mEq/L (ref 135–145)
Total Bilirubin: 0.8 mg/dL (ref 0.2–1.2)
Total Protein: 7 g/dL (ref 6.0–8.3)

## 2020-10-20 LAB — CBC WITH DIFFERENTIAL/PLATELET
Basophils Absolute: 0 10*3/uL (ref 0.0–0.1)
Basophils Relative: 0.3 % (ref 0.0–3.0)
Eosinophils Absolute: 0.1 10*3/uL (ref 0.0–0.7)
Eosinophils Relative: 1.3 % (ref 0.0–5.0)
HCT: 39.7 % (ref 36.0–46.0)
Hemoglobin: 13.8 g/dL (ref 12.0–15.0)
Lymphocytes Relative: 19.8 % (ref 12.0–46.0)
Lymphs Abs: 0.8 10*3/uL (ref 0.7–4.0)
MCHC: 34.8 g/dL (ref 30.0–36.0)
MCV: 91.6 fl (ref 78.0–100.0)
Monocytes Absolute: 0.3 10*3/uL (ref 0.1–1.0)
Monocytes Relative: 6.3 % (ref 3.0–12.0)
Neutro Abs: 3 10*3/uL (ref 1.4–7.7)
Neutrophils Relative %: 72.3 % (ref 43.0–77.0)
Platelets: 182 10*3/uL (ref 150.0–400.0)
RBC: 4.34 Mil/uL (ref 3.87–5.11)
RDW: 12.5 % (ref 11.5–15.5)
WBC: 4.1 10*3/uL (ref 4.0–10.5)

## 2020-10-20 LAB — LIPID PANEL
Cholesterol: 130 mg/dL (ref 0–200)
HDL: 42.2 mg/dL (ref >39.00)
LDL Cholesterol: 70 mg/dL (ref 0–99)
NonHDL: 87.77
Total CHOL/HDL Ratio: 3
Triglycerides: 88 mg/dL (ref 0.0–149.0)
VLDL: 17.6 mg/dL (ref 0.0–40.0)

## 2020-10-20 LAB — HEMOGLOBIN A1C: Hgb A1c MFr Bld: 4.9 % (ref 4.6–6.5)

## 2020-10-20 LAB — TSH: TSH: 0.17 u[IU]/mL — ABNORMAL LOW (ref 0.35–5.50)

## 2020-10-20 MED ORDER — LEVOTHYROXINE SODIUM 112 MCG PO TABS: 112.0000 ug | ORAL_TABLET | Freq: Every day | ORAL | 1 refills | Status: DC

## 2020-10-20 NOTE — Patient Instructions (Addendum)
Let me know if you would like to receive the pneumonia vaccine and which one.  I will order the cologuard.  Td vaccine today.  Low intensity exercise most days per week such as walking/biking/swimming. Goal 173mn per week.  If any concerns on labs I will let you know.   Preventive Care [id number] Old, Female Preventive care refers to lifestyle choices and visits with your health care provider that can promote health and wellness. This includes: A yearly physical exam. This is also called an annual wellness visit. Regular dental and eye exams. Immunizations. Screening for certain conditions. Healthy lifestyle choices, such as: Eating a healthy diet. Getting regular exercise. Not using drugs or products that contain nicotine and tobacco. Limiting alcohol use. What can I expect for my preventive care visit? Physical exam Your health care provider will check your: Height and weight. These may be used to calculate your BMI (body mass index). BMI is a measurement that tells if you are at a healthy weight. Heart rate and blood pressure. Body temperature. Skin for abnormal spots. Counseling Your health care provider may ask you questions about your: Past medical problems. Family's medical history. Alcohol, tobacco, and drug use. Emotional well-being. Home life and relationship well-being. Sexual activity. Diet, exercise, and sleep habits. Work and work [employer]istician Access to firearms. Method of birth control. Menstrual cycle. Pregnancy history. What immunizations do I need?  Vaccines are usually given at various ages, according to a schedule. Your health care provider will recommend vaccines for you based on your age, medicalhistory, and lifestyle or other factors, such as travel or where you work. What tests do I need? Blood tests Lipid and cholesterol levels. These may be checked every 5 years, or more often if you are over 53years old. Hepatitis C test. Hepatitis B  test. Screening Lung cancer screening. You may have this screening every year starting at age 5353if you have a 30-pack-year history of smoking and currently smoke or have quit within the past 15 years. Colorectal cancer screening. All adults should have this screening starting at age 5353and continuing until age 5361 Your health care provider may recommend screening at age 6164if you are at increased risk. You will have tests every 1-10 years, depending on your results and the type of screening test. Diabetes screening. This is done by checking your blood sugar (glucose) after you have not eaten for a while (fasting). You may have this done every 1-3 years. Mammogram. This may be done every 1-2 years. Talk with your health care provider about when you should start having regular mammograms. This may depend on whether you have a family history of breast cancer. BRCA-related cancer screening. This may be done if you have a family history of breast, ovarian, tubal, or peritoneal cancers. Pelvic exam and Pap test. This may be done every 3 years starting at age 53 Starting at age 53 this may be done every 5 years if you have a Pap test in combination with an HPV test. Other tests STD (sexually transmitted disease) testing, if you are at risk. Bone density scan. This is done to screen for osteoporosis. You may have this scan if you are at high risk for osteoporosis. Talk with your health care provider about your test results, treatment options,and if necessary, the need for more tests. Follow these instructions at home: Eating and drinking  Eat a diet that includes fresh fruits and vegetables, whole grains, lean protein, and low-fat dairy products. Take  vitamin and mineral supplements as recommended by your health care provider. Do not drink alcohol if: Your health care provider tells you not to drink. You are pregnant, may be pregnant, or are planning to become pregnant. If you drink  alcohol: Limit how much you have to 0-1 drink a day. Be aware of how much alcohol is in your drink. In the [place] one drink equals one 12 oz bottle of beer (355 mL), one 5 oz glass of wine (148 mL), or one 1 oz glass of hard liquor (44 mL).  Lifestyle Take daily care of your teeth and gums. Brush your teeth every morning and night with fluoride toothpaste. Floss one time each day. Stay active. Exercise for at least 30 minutes 5 or more days each week. Do not use any products that contain nicotine or tobacco, such as cigarettes, e-cigarettes, and chewing tobacco. If you need help quitting, ask your health care provider. Do not use drugs. If you are sexually active, practice safe sex. Use a condom or other form of protection to prevent STIs (sexually transmitted infections). If you do not wish to become pregnant, use a form of birth control. If you plan to become pregnant, see your health care provider for a prepregnancy visit. If told by your health care provider, take low-dose aspirin daily starting at age 65. Find healthy ways to cope with stress, such as: Meditation, yoga, or listening to music. Journaling. Talking to a trusted person. Spending time with friends and family. Safety Always wear your seat belt while driving or riding in a vehicle. Do not drive: If you have been drinking alcohol. Do not ride with someone who has been drinking. When you are tired or distracted. While texting. Wear a helmet and other protective equipment during sports activities. If you have firearms in your house, make sure you follow all gun safety procedures. What's next? Visit your health care provider once a year for an annual wellness visit. Ask your health care provider how often you should have your eyes and teeth checked. Stay up to date on all vaccines. This information is not intended to replace advice given to you by your health care provider. Make sure you discuss any questions you have with  your healthcare provider. Document Revised: 11/18/2019 Document Reviewed: 10/25/2017 Elsevier Patient Education  2022 [employer].

## 2020-10-20 NOTE — Progress Notes (Signed)
Subjective:  Patient ID: Gloria Kramer, female    DOB: Jan 08, 1966  Age: 55 y.o. MRN: [id number]  CC:  Chief Complaint  Patient presents with   Annual Exam    Pt her for physical today no concerns denies need for refills     HPI Gloria Kramer presents for   Annual physical exam  Hypothyroidism: Lab Results  Component Value Date   TSH 15.600 (H) 10/17/2019  Treated with Synthroid 112 mcg daily.  Elevated TSH in August 2021.  Few missed doses of medications time, recommended repeat testing in 6 weeks.  Currently taking medication daily. No new hot or cold intolerance. Hair does seem to be falling out, but on tamoxifen and may be going through menopause (hot flushes) no new skin changes, heart palpitations or new fatigue. No new weight changes.   History of breast cancer Oncologist Dr. Lindi Adie, surgeon Dr. Marlou Starks.  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  Tamoxifen, adjuvant radiation therapy. Oncology appointment in January. Plan for tamoxifen 20 mg daily x10 years.  MRIs were deferred given cost, plan for continued mammograms.  1 year follow-up. She did have an MRI May 26, no MRI evidence of malignancy, annual screening mammography recommended.  Cancer screening Colon: Discussed testing last year. Screening options with colonoscopy versus Cologuard discussed. Discussed timing of repeat testing intervals if normal, as well as potential need for diagnostic Colonoscopy if positive Cologuard. Understanding expressed, and chose Cologuard.   Cervical cancer screening, OB/GYN Dr. Corinna Capra, yearly testing. Appt in October.  Mammogram, and MRI in May.  Immunization History  Administered Date(s) Administered   Influenza Split 11/28/2010   Td 10/20/2020   Tdap 01/16/2011  pneumonia vaccine - she would like to research.  Declines shingles vaccine -declines.  Declines COVID-vaccine - had covid in December.  Requests Td only as booster today.  Declines Hep C test - had at  prior practice - negative.  Depression screen [facility] 2/9 10/20/2020 08/15/2017 07/18/2017 07/21/2014  Decreased Interest 0 0 0 0  Down, Depressed, Hopeless 0 0 0 0  PHQ - 2 Score 0 0 0 0  Altered sleeping 0 - - -  Tired, decreased energy 0 - - -  Change in appetite 0 - - -  Feeling bad or failure about yourself  0 - - -  Trouble concentrating 0 - - -  Moving slowly or fidgety/restless 0 - - -  Suicidal thoughts 0 - - -  PHQ-9 Score 0 - - -   Vision Screening   Right eye Left eye Both eyes  Without correction     With correction 20/15 20/15 20/15  Optho - august 2nd  Alcohol:none  Tobacco:none  Dental: every 6 months  Exercise: less past year. Father passed last June.  Dtr just started school - more time for herself. Some walking -1.5-54m.    History Patient Active Problem List   Diagnosis Date Noted   Genetic testing 07/30/2017   Family history of breast cancer    Family history of brain cancer    Malignant neoplasm of upper-inner quadrant of left breast in female, estrogen receptor positive [place] 07/18/2017   Iron deficiency anemia 03/08/2017   Hypothyroid 01/16/2011   Perimenopause 01/16/2011   Past Medical History:  Diagnosis Date   Anemia    iron deficiency from heavy periods   Cancer ([facility] 07/2017   left breast cancer   Family history of brain cancer    Family history of breast cancer  Hypothyroidism    Hoshimoto's thyroiditis   Personal history of radiation therapy    Thyroid disease    Past Surgical History:  Procedure Laterality Date   BREAST BIOPSY Left 07/09/2017   BREAST BIOPSY Right 08/09/2017   BREAST BIOPSY Bilateral 03/06/2019   BREAST LUMPECTOMY Left 07/2017   BREAST LUMPECTOMY WITH AXILLARY LYMPH NODE BIOPSY Left 08/27/2017   Procedure: LEFT BREAST LUMPECTOMY WITH AXILLARY LYMPH NODE BIOPSY;  Surgeon: Jovita Kussmaul, MD;  Location: [place];  Service: General;  Laterality: Left;   WISDOM TOOTH EXTRACTION     Allergies   Allergen Reactions   Penicillins Rash    Unsure reaction was when she was a child   Prior to Admission medications   Medication Sig Start Date End Date Taking? Authorizing Provider  b complex vitamins capsule Take 1 capsule by mouth daily. 07/24/17  Yes Nicholas Lose, MD  levothyroxine (SYNTHROID) 112 MCG tablet TAKE 1 TABLET DAILY BEFORE BREAKFAST 10/11/20  Yes Wendie Agreste, MD  Polysaccharide Iron Complex (NOVAFERRUM 50) 50 MG CAPS Take 50 mg by mouth daily. 07/24/17  Yes Nicholas Lose, MD  tamoxifen (NOLVADEX) 20 MG tablet Take 1 tablet (20 mg total) by mouth daily. 03/23/20  Yes Nicholas Lose, MD   Social History   Socioeconomic History   Marital status: Married    Spouse name: Not on file   Number of children: 5   Years of education: Not on file   Highest education level: Not on file  Occupational History   Not on file  Tobacco Use   Smoking status: Never   Smokeless tobacco: Never  Vaping Use   Vaping Use: Never used  Substance and Sexual Activity   Alcohol use: Not Currently    Comment: 1 drink or beer or wine a few nights a week    Drug use: Never   Sexual activity: Yes    Birth control/protection: None  Other Topics Concern   Not on file  Social History Narrative   Not on file   Social Determinants of Health   Financial Resource Strain: Not on file  Food Insecurity: Not on file  Transportation Needs: Not on file  Physical Activity: Not on file  Stress: Not on file  Social Connections: Not on file  Intimate Partner Violence: Not on file    Review of Systems 13 point review of systems per patient health survey noted.  Negative other than as indicated above or in HPI.    Objective:   Vitals:   10/20/20 0831  BP: 132/68  Pulse: 74  Resp: 16  Temp: 98.2 F (36.8 C)  TempSrc: Temporal  SpO2: 96%  Weight: 140 lb (63.5 kg)  Height: 5' 8" (1.727 m)     Physical Exam Constitutional:      Appearance: She is well-developed.  HENT:     Head:  Normocephalic and atraumatic.     Right Ear: External ear normal.     Left Ear: External ear normal.  Eyes:     Conjunctiva/sclera: Conjunctivae normal.     Pupils: Pupils are equal, round, and reactive to light.  Neck:     Thyroid: No thyromegaly.  Cardiovascular:     Rate and Rhythm: Normal rate and regular rhythm.     Heart sounds: Normal heart sounds. No murmur heard. Pulmonary:     Effort: Pulmonary effort is normal. No respiratory distress.     Breath sounds: Normal breath sounds. No wheezing.  Abdominal:  General: Bowel sounds are normal.     Palpations: Abdomen is soft.     Tenderness: There is no abdominal tenderness.  Musculoskeletal:        General: No tenderness. Normal range of motion.     Cervical back: Normal range of motion and neck supple.  Lymphadenopathy:     Cervical: No cervical adenopathy.  Skin:    General: Skin is warm and dry.     Findings: No rash.  Neurological:     Mental Status: She is alert and oriented to person, place, and time.  Psychiatric:        Behavior: Behavior normal.        Thought Content: Thought content normal.       Assessment & Plan:  Gloria Kramer is a 55 y.o. female . Annual physical exam - Plan: Comprehensive metabolic panel, Lipid panel, Hemoglobin A1c, CBC with Differential/Platelet  - -anticipatory guidance as below in AVS, screening labs above. Health maintenance items as above in HPI discussed/recommended as applicable.   - Recommended pneumonia vaccination, would recommend Prevnar 20.  She would like to research these vaccines further and will let us know.  If she chooses to have vaccine, can be performed at nurse visit.  Acquired hypothyroidism - Plan: Comprehensive metabolic panel Hypothyroidism, unspecified type - Plan: levothyroxine (SYNTHROID) 112 MCG tablet, TSH  -Uncontrolled by last TSH, but intermittent missed doses that time, noncompliant, daily dosing, check TSH, continue same regimen.  History of  anemia - Plan: Comprehensive metabolic panel, CBC with Differential/Platelet  Screening for hyperlipidemia - Plan: Comprehensive metabolic panel, Lipid panel  Screening for diabetes mellitus - Plan: Comprehensive metabolic panel, Hemoglobin A1c  Screen for colon cancer - Plan: Cologuard  Need for vaccine for DT (diphtheria-tetanus) - Plan: Td vaccine greater than or equal to 7yo preservative free IM -due in November, would like to go ahead and update tetanus vaccine now.  Requested Td, not Tdap.  Potential side effects discussed.   Meds ordered this encounter  Medications   levothyroxine (SYNTHROID) 112 MCG tablet    Sig: Take 1 tablet (112 mcg total) by mouth daily before breakfast.    Dispense:  90 tablet    Refill:  1   Patient Instructions  Let me know if you would like to receive the pneumonia vaccine and which one.  I will order the cologuard.  Td vaccine today.  Low intensity exercise most days per week such as walking/biking/swimming. Goal 1102mn per week.  If any concerns on labs I will let you know.   Preventive Care [id number] Old, Female Preventive care refers to lifestyle choices and visits with your health care provider that can promote health and wellness. This includes: A yearly physical exam. This is also called an annual wellness visit. Regular dental and eye exams. Immunizations. Screening for certain conditions. Healthy lifestyle choices, such as: Eating a healthy diet. Getting regular exercise. Not using drugs or products that contain nicotine and tobacco. Limiting alcohol use. What can I expect for my preventive care visit? Physical exam Your health care provider will check your: Height and weight. These may be used to calculate your BMI (body mass index). BMI is a measurement that tells if you are at a healthy weight. Heart rate and blood pressure. Body temperature. Skin for abnormal spots. Counseling Your health care provider may ask you questions  about your: Past medical problems. Family's medical history. Alcohol, tobacco, and drug use. Emotional well-being. Home life and relationship well-being.  Sexual activity. Diet, exercise, and sleep habits. Work and work Statistician. Access to firearms. Method of birth control. Menstrual cycle. Pregnancy history. What immunizations do I need?  Vaccines are usually given at various ages, according to a schedule. Your health care provider will recommend vaccines for you based on your age, medicalhistory, and lifestyle or other factors, such as travel or where you work. What tests do I need? Blood tests Lipid and cholesterol levels. These may be checked every 5 years, or more often if you are over 34 years old. Hepatitis C test. Hepatitis B test. Screening Lung cancer screening. You may have this screening every year starting at age 81 if you have a 30-pack-year history of smoking and currently smoke or have quit within the past 15 years. Colorectal cancer screening. All adults should have this screening starting at age 58 and continuing until age 13. Your health care provider may recommend screening at age 22 if you are at increased risk. You will have tests every 1-10 years, depending on your results and the type of screening test. Diabetes screening. This is done by checking your blood sugar (glucose) after you have not eaten for a while (fasting). You may have this done every 1-3 years. Mammogram. This may be done every 1-2 years. Talk with your health care provider about when you should start having regular mammograms. This may depend on whether you have a family history of breast cancer. BRCA-related cancer screening. This may be done if you have a family history of breast, ovarian, tubal, or peritoneal cancers. Pelvic exam and Pap test. This may be done every 3 years starting at age 2. Starting at age 85, this may be done every 5 years if you have a Pap test in combination with  an HPV test. Other tests STD (sexually transmitted disease) testing, if you are at risk. Bone density scan. This is done to screen for osteoporosis. You may have this scan if you are at high risk for osteoporosis. Talk with your health care provider about your test results, treatment options,and if necessary, the need for more tests. Follow these instructions at home: Eating and drinking  Eat a diet that includes fresh fruits and vegetables, whole grains, lean protein, and low-fat dairy products. Take vitamin and mineral supplements as recommended by your health care provider. Do not drink alcohol if: Your health care provider tells you not to drink. You are pregnant, may be pregnant, or are planning to become pregnant. If you drink alcohol: Limit how much you have to 0-1 drink a day. Be aware of how much alcohol is in your drink. In the [place] one drink equals one 12 oz bottle of beer (355 mL), one 5 oz glass of wine (148 mL), or one 1 oz glass of hard liquor (44 mL).  Lifestyle Take daily care of your teeth and gums. Brush your teeth every morning and night with fluoride toothpaste. Floss one time each day. Stay active. Exercise for at least 30 minutes 5 or more days each week. Do not use any products that contain nicotine or tobacco, such as cigarettes, e-cigarettes, and chewing tobacco. If you need help quitting, ask your health care provider. Do not use drugs. If you are sexually active, practice safe sex. Use a condom or other form of protection to prevent STIs (sexually transmitted infections). If you do not wish to become pregnant, use a form of birth control. If you plan to become pregnant, see your health care provider for a  prepregnancy visit. If told by your health care provider, take low-dose aspirin daily starting at age 55. Find healthy ways to cope with stress, such as: Meditation, yoga, or listening to music. Journaling. Talking to a trusted person. Spending time with  friends and family. Safety Always wear your seat belt while driving or riding in a vehicle. Do not drive: If you have been drinking alcohol. Do not ride with someone who has been drinking. When you are tired or distracted. While texting. Wear a helmet and other protective equipment during sports activities. If you have firearms in your house, make sure you follow all gun safety procedures. What's next? Visit your health care provider once a year for an annual wellness visit. Ask your health care provider how often you should have your eyes and teeth checked. Stay up to date on all vaccines. This information is not intended to replace advice given to you by your health care provider. Make sure you discuss any questions you have with your healthcare provider. Document Revised: 11/18/2019 Document Reviewed: 10/25/2017 Elsevier Patient Education  2022 [place],   Merri Ray, MD [employer] 10/20/20 12:23 PM

## 2020-10-21 ENCOUNTER — Encounter: Payer: Self-pay | Admitting: Family Medicine

## 2020-10-21 DIAGNOSIS — E039 Hypothyroidism, unspecified: Secondary | ICD-10-CM

## 2020-10-22 MED ORDER — LEVOTHYROXINE SODIUM 100 MCG PO TABS
100.0000 ug | ORAL_TABLET | Freq: Every day | ORAL | 1 refills | Status: DC
Start: 2020-10-22 — End: 2020-11-05

## 2020-10-22 NOTE — Telephone Encounter (Signed)
See labs 

## 2020-11-04 ENCOUNTER — Encounter: Payer: Self-pay | Admitting: Family Medicine

## 2020-11-05 ENCOUNTER — Other Ambulatory Visit: Payer: Self-pay

## 2020-11-05 DIAGNOSIS — E039 Hypothyroidism, unspecified: Secondary | ICD-10-CM

## 2020-11-05 MED ORDER — LEVOTHYROXINE SODIUM 100 MCG PO TABS: 100.0000 ug | ORAL_TABLET | Freq: Every day | ORAL | 1 refills | Status: DC

## 2021-03-21 NOTE — Progress Notes (Signed)
Patient Care Team: Gloria Agreste, MD as PCP - General (Family Medicine) Nicholas Lose, MD as Consulting Physician (Hematology and Oncology) Louretta Shorten, MD as Consulting Physician (Obstetrics and Gynecology) Kyung Rudd, MD as Consulting Physician (Radiation Oncology) Jovita Kussmaul, MD as Consulting Physician (General Surgery) Gardenia Phlegm, NP as Nurse Practitioner (Hematology and Oncology)  DIAGNOSIS:    ICD-10-CM   1. Malignant neoplasm of upper-inner quadrant of left breast in female, estrogen receptor positive ([place])  C50.212    Z17.0       SUMMARY OF ONCOLOGIC HISTORY: Oncology History  Malignant neoplasm of upper-inner quadrant of left breast in female, estrogen receptor positive ([place])  07/09/2017 Initial Diagnosis   Palpable mass left breast 3 cm from the nipple measuring 1.6 x 1.8 x 2.5 cm, biopsy revealed grade 2 IDC ER 95%, PR 90%, Ki-67 15%, HER-2 negative ratio 1.26, T2 N0 stage Ib clinical stage AJCC 8   07/27/2017 Genetic Testing   CHEK2 p.M381V VUS identified on the CancerNext panel.  The CancerNext gene panel offered by [employer] includes sequencing and rearrangement analysis for the following 34 genes:   APC, ATM, BARD1, BMPR1A, BRCA1, BRCA2, BRIP1, CDH1, CDK4, CDKN2A, CHEK2, DICER1, HOXB13, EPCAM, GREM1, MLH1, MRE11A, MSH2, MSH6, MUTYH, NBN, NF1, PALB2, PMS2, POLD1, POLE, PTEN, RAD50, RAD51C, RAD51D, SMAD4, SMARCA4, STK11, and TP53.  The report date is Jul 27, 2017.   08/27/2017 Surgery   Left lumpectomy: IDC grade 2, 2.2 cm, low-grade DCIS, lymphovascular invasion identified, margins negative, 1 lymph node with micrometastatic disease, 3 additional lymph nodes negative, ER 95%, PR 90%, Ki-67 15%, HER-2 negative ratio 1.26, T2N1 mic stage Ib   08/27/2017 Oncotype testing   17/5%   09/03/2017 Cancer Staging   Staging form: Breast, AJCC 8th Edition - Pathologic: Stage IB (pT2, pN29m, cM0, G2, ER+, PR+, HER2-) - Signed by GNicholas Lose MD on  09/03/2017    10/15/2017 - 11/19/2017 Radiation Therapy   Adjuvant XRT   12/17/2017 -  Anti-estrogen oral therapy   Tamoxifen 286mdaily     CHIEF COMPLIANT: Follow-up of left breast cancer on tamoxifen therapy  INTERVAL HISTORY: SuCASSY SPROWLs a [age].o. with above-mentioned history of left breast cancer who underwent a lumpectomy, radiation, and is currently on antiestrogen therapy with tamoxifen. Mammogram on 07/09/2020 showed no evidence of malignancy bilaterally. She presents to the clinic today for follow-up.   ALLERGIES:  is allergic to penicillins.  MEDICATIONS:  Current Outpatient Medications  Medication Sig Dispense Refill   b complex vitamins capsule Take 1 capsule by mouth daily.     levothyroxine (SYNTHROID) 100 MCG tablet Take 1 tablet (100 mcg total) by mouth daily before breakfast. 90 tablet 1   Polysaccharide Iron Complex (NOVAFERRUM 50) 50 MG CAPS Take 50 mg by mouth daily. 90 capsule    tamoxifen (NOLVADEX) 20 MG tablet Take 1 tablet (20 mg total) by mouth daily. 90 tablet 3   No current facility-administered medications for this visit.    PHYSICAL EXAMINATION: ECOG PERFORMANCE STATUS: 1 - Symptomatic but completely ambulatory  Vitals:   03/22/21 1021  BP: 110/78  Pulse: 87  Resp: 18  Temp: 97.7 F (36.5 C)  SpO2: 100%   Filed Weights   03/22/21 1021  Weight: 142 lb 11.2 oz (64.7 kg)    BREAST: No palpable masses or nodules in either right or left breasts. No palpable axillary supraclavicular or infraclavicular adenopathy no breast tenderness or nipple discharge. (exam performed in the presence of  a chaperone)  LABORATORY DATA:  I have reviewed the data as listed CMP Latest Ref Rng & Units 10/20/2020 10/17/2019 05/07/2018  Glucose 70 - 99 mg/dL 88 106(H) 94  BUN 6 - 23 mg/dL _0 Creatinine 0.40 - 1.20 mg/dL 0.70 0.80 0.80  Sodium 135 - 145 mEq/L 140 140 142  Potassium 3.5 - 5.1 mEq/L 3.5 5.0 4.0  Chloride 96 - 112 mEq/L 105 102 106  CO2 19 -  32 mEq/L _1 Calcium 8.4 - 10.5 mg/dL 9.2 9.5 8.8(L)  Total Protein 6.0 - 8.3 g/dL 7.0 7.0 6.6  Total Bilirubin 0.2 - 1.2 mg/dL 0.8 0.7 0.6  Alkaline Phos 39 - 117 U/L 49 55 46  AST 0 - 37 U/L _2 ALT 0 - 35 U/L _3 Lab Results  Component Value Date   WBC 4.1 10/20/2020   HGB 13.8 10/20/2020   HCT 39.7 10/20/2020   MCV 91.6 10/20/2020   PLT 182.0 10/20/2020   NEUTROABS 3.0 10/20/2020    ASSESSMENT & PLAN:  Malignant neoplasm of upper-inner quadrant of left breast in female, estrogen receptor positive ([place]) 08/27/2017:Left lumpectomy: IDC grade 2, 2.2 cm, low-grade DCIS, lymphovascular invasion identified, margins negative, 1 lymph node with micrometastatic disease, 3 additional lymph nodes negative, ER 95%, PR 90%, Ki-67 15%, HER-2 negative ratio 1.26, T2N1 mic stage Ib Oncotype DX recurrence score 17: Risk of distant recurrence 5% Adjuvant radiation therapy 10/13/2017 to 11/19/2017   Treatment plan: Oral antiestrogen therapy with tamoxifen 20 mg daily x10 years started 12/17/2017   Tamoxifen toxicities: Hot flashes Next year we will consider doing breast cancer index to decide if she needs to be on extended adjuvant therapy. Patient is exercising frequently and has been eating very healthy.   Breast cancer surveillance: 1.  Breast exam 03/22/2021: Benign 2.  Mammogram 07/09/2020: Postlumpectomy changes, benign, breast density category C 3.  Breast MRI 07/22/20: benign, density Cat C. Right breast biopsies 03/06/2019: ADH with fibrocystic changes and benign breast tissue   Return to clinic in 1 year for follow-up    No orders of the defined types were placed in this encounter.  The patient has a good understanding of the overall plan. she agrees with it. she will call with any problems that may develop before the next visit here.  Total time spent: 30 mins including face to face time and time spent for planning, charting and coordination of care  Rulon Eisenmenger, MD, MPH 03/22/2021  I, Thana Ates, am acting as scribe for Dr. Nicholas Lose.  I have reviewed the above documentation for accuracy and completeness, and I agree with the above.

## 2021-03-22 ENCOUNTER — Inpatient Hospital Stay: Payer: POS | Attending: Hematology and Oncology | Admitting: Hematology and Oncology

## 2021-03-22 ENCOUNTER — Other Ambulatory Visit: Payer: Self-pay

## 2021-03-22 DIAGNOSIS — Z170 Estrogen receptor positive status [ER+]: Secondary | ICD-10-CM | POA: Diagnosis not present

## 2021-03-22 DIAGNOSIS — C50212 Malignant neoplasm of upper-inner quadrant of left female breast: Secondary | ICD-10-CM | POA: Diagnosis present

## 2021-03-22 DIAGNOSIS — Z923 Personal history of irradiation: Secondary | ICD-10-CM | POA: Insufficient documentation

## 2021-03-22 DIAGNOSIS — Z79810 Long term (current) use of selective estrogen receptor modulators (SERMs): Secondary | ICD-10-CM | POA: Diagnosis not present

## 2021-03-22 MED ORDER — THERA VITAL M PO TABS: 1.0000 | ORAL_TABLET | Freq: Every day | ORAL | Status: DC

## 2021-03-22 MED ORDER — TAMOXIFEN CITRATE 20 MG PO TABS: 10.0000 mg | ORAL_TABLET | Freq: Every day | ORAL | 3 refills | Status: DC

## 2021-03-22 NOTE — Assessment & Plan Note (Signed)
08/27/2017:Left lumpectomy: IDC grade 2, 2.2 cm, low-grade DCIS, lymphovascular invasion identified, margins negative, 1 lymph node with micrometastatic disease, 3 additional lymph nodes negative, ER 95%, PR 90%, Ki-67 15%, HER-2 negative ratio 1.26, T2N1 mic stage Ib Oncotype DX recurrence score 17: Risk of distant recurrence 5% Adjuvant radiation therapy 10/13/2017 to 11/19/2017  Treatment plan: Oral antiestrogen therapy with tamoxifen 20 mg daily x10 years She had 1 cycle in November and her gynecologist is monitoring and she is not menopausal yet.  Tamoxifen toxicities: Hot flashes  Patient is exercising frequently and has been eating very healthy. COVID-19 infection in November 2021: She lost significant weight and was sick for 3 weeks  Breast cancer surveillance: 1.Breast exam1/24/2023: Benign 2.Mammogram 07/09/2020: Postlumpectomy changes, benign, breast density category C 3.Breast MRI 07/22/20: benign, density Cat C. Right breast biopsies1/08/2019: ADH with fibrocystic changes and benign breast tissue  Return to clinic in 1 year for follow-up

## 2021-03-28 ENCOUNTER — Other Ambulatory Visit: Payer: Self-pay | Admitting: *Deleted

## 2021-03-28 MED ORDER — TAMOXIFEN CITRATE 10 MG PO TABS: 10.0000 mg | ORAL_TABLET | Freq: Every day | ORAL | 3 refills | Status: DC

## 2021-03-28 NOTE — Progress Notes (Signed)
Received call from pt stating Tamoxifen 20 mg tablet is too small to cut in half and requesting Tamoxifen 10 mg to be sent to pharmacy on file.  RN reviewed with MD and verbal orders for pt to receive Tamoxifen 10 mg p.o daily.  Prescription sent to pharmacy on file.

## 2021-04-21 ENCOUNTER — Ambulatory Visit: Payer: POS | Admitting: Family Medicine

## 2021-04-22 ENCOUNTER — Ambulatory Visit: Payer: POS | Admitting: Family Medicine

## 2021-05-04 ENCOUNTER — Other Ambulatory Visit: Payer: Self-pay | Admitting: Family Medicine

## 2021-05-04 DIAGNOSIS — E039 Hypothyroidism, unspecified: Secondary | ICD-10-CM

## 2021-06-24 ENCOUNTER — Other Ambulatory Visit: Payer: Self-pay | Admitting: Hematology and Oncology

## 2021-06-24 DIAGNOSIS — Z1231 Encounter for screening mammogram for malignant neoplasm of breast: Secondary | ICD-10-CM

## 2021-07-12 ENCOUNTER — Ambulatory Visit
Admission: RE | Admit: 2021-07-12 | Discharge: 2021-07-12 | Disposition: A | Discharge status: Home / Self Care | Payer: POS | Source: Ambulatory Visit | Attending: Hematology and Oncology | Admitting: Hematology and Oncology

## 2021-07-12 DIAGNOSIS — Z1231 Encounter for screening mammogram for malignant neoplasm of breast: Secondary | ICD-10-CM

## 2021-07-14 ENCOUNTER — Other Ambulatory Visit: Payer: Self-pay | Admitting: Hematology and Oncology

## 2021-07-14 DIAGNOSIS — N63 Unspecified lump in unspecified breast: Secondary | ICD-10-CM

## 2021-07-22 ENCOUNTER — Ambulatory Visit
Admission: RE | Admit: 2021-07-22 | Discharge: 2021-07-22 | Disposition: A | Discharge status: Home / Self Care | Payer: POS | Source: Ambulatory Visit | Attending: Hematology and Oncology | Admitting: Hematology and Oncology

## 2021-07-22 ENCOUNTER — Ambulatory Visit: Payer: POS

## 2021-07-22 DIAGNOSIS — N63 Unspecified lump in unspecified breast: Secondary | ICD-10-CM

## 2022-03-06 ENCOUNTER — Other Ambulatory Visit: Payer: Self-pay | Admitting: Hematology and Oncology

## 2022-03-21 NOTE — Assessment & Plan Note (Signed)
08/27/2017:Left lumpectomy: IDC grade 2, 2.2 cm, low-grade DCIS, lymphovascular invasion identified, margins negative, 1 lymph node with micrometastatic disease, 3 additional lymph nodes negative, ER 95%, PR 90%, Ki-67 15%, HER-2 negative ratio 1.26, T2N1 mic stage Ib Oncotype DX recurrence score 17: Risk of distant recurrence 5% Adjuvant radiation therapy 10/13/2017 to 11/19/2017   Treatment plan: Oral antiestrogen therapy with tamoxifen 20 mg daily x10 years started 12/17/2017   Tamoxifen toxicities: Hot flashes We will obtain breast cancer index to determine if she would benefit from extended endocrine therapy.  Will call her with the result of this test.  Patient is exercising frequently and has been eating very healthy.   Breast cancer surveillance: 1.  Breast exam 03/22/2022: Benign 2.  Mammogram 07/22/21: Postlumpectomy changes, benign, breast density category C 3.  Breast MRI 07/22/20: benign, density Cat C. Right breast biopsies 03/06/2019: ADH with fibrocystic changes and benign breast tissue   Return to clinic in 1 year for follow-up

## 2022-03-22 ENCOUNTER — Other Ambulatory Visit: Payer: Self-pay

## 2022-03-22 ENCOUNTER — Inpatient Hospital Stay: Payer: 59 | Attending: Hematology and Oncology | Admitting: Hematology and Oncology

## 2022-03-22 ENCOUNTER — Encounter: Payer: Self-pay | Admitting: Oncology

## 2022-03-22 VITALS — BP 139/88 | HR 105 | Temp 97.5°F | Resp 18 | Ht 68.0 in | Wt 144.3 lb

## 2022-03-22 DIAGNOSIS — Z79810 Long term (current) use of selective estrogen receptor modulators (SERMs): Secondary | ICD-10-CM | POA: Insufficient documentation

## 2022-03-22 DIAGNOSIS — C50212 Malignant neoplasm of upper-inner quadrant of left female breast: Secondary | ICD-10-CM | POA: Diagnosis present

## 2022-03-22 DIAGNOSIS — Z170 Estrogen receptor positive status [ER+]: Secondary | ICD-10-CM | POA: Diagnosis not present

## 2022-03-22 DIAGNOSIS — Z923 Personal history of irradiation: Secondary | ICD-10-CM | POA: Insufficient documentation

## 2022-03-22 DIAGNOSIS — R61 Generalized hyperhidrosis: Secondary | ICD-10-CM | POA: Insufficient documentation

## 2022-03-22 NOTE — Progress Notes (Signed)
Patient Care Team: Wendie Agreste, MD as PCP - General (Family Medicine) Nicholas Lose, MD as Consulting Physician (Hematology and Oncology) Louretta Shorten, MD as Consulting Physician (Obstetrics and Gynecology) Kyung Rudd, MD as Consulting Physician (Radiation Oncology) Jovita Kussmaul, MD as Consulting Physician (General Surgery) Delice Bison Charlestine Massed, NP as Nurse Practitioner (Hematology and Oncology)  DIAGNOSIS:  Encounter Diagnosis  Name Primary?   Malignant neoplasm of upper-inner quadrant of left breast in female, estrogen receptor positive ([place]) Yes    SUMMARY OF ONCOLOGIC HISTORY: Oncology History  Malignant neoplasm of upper-inner quadrant of left breast in female, estrogen receptor positive ([place])  07/09/2017 Initial Diagnosis   Palpable mass left breast 3 cm from the nipple measuring 1.6 x 1.8 x 2.5 cm, biopsy revealed grade 2 IDC ER 95%, PR 90%, Ki-67 15%, HER-2 negative ratio 1.26, T2 N0 stage Ib clinical stage AJCC 8   07/27/2017 Genetic Testing   CHEK2 p.M381V VUS identified on the CancerNext panel.  The CancerNext gene panel offered by [employer] includes sequencing and rearrangement analysis for the following 34 genes:   APC, ATM, BARD1, BMPR1A, BRCA1, BRCA2, BRIP1, CDH1, CDK4, CDKN2A, CHEK2, DICER1, HOXB13, EPCAM, GREM1, MLH1, MRE11A, MSH2, MSH6, MUTYH, NBN, NF1, PALB2, PMS2, POLD1, POLE, PTEN, RAD50, RAD51C, RAD51D, SMAD4, SMARCA4, STK11, and TP53.  The report date is Jul 27, 2017.   08/27/2017 Surgery   Left lumpectomy: IDC grade 2, 2.2 cm, low-grade DCIS, lymphovascular invasion identified, margins negative, 1 lymph node with micrometastatic disease, 3 additional lymph nodes negative, ER 95%, PR 90%, Ki-67 15%, HER-2 negative ratio 1.26, T2N1 mic stage Ib   08/27/2017 Oncotype testing   17/5%   09/03/2017 Cancer Staging   Staging form: Breast, AJCC 8th Edition - Pathologic: Stage IB (pT2, pN32m, cM0, G2, ER+, PR+, HER2-) - Signed by GNicholas Lose MD on  09/03/2017   10/15/2017 - 11/19/2017 Radiation Therapy   Adjuvant XRT   12/17/2017 -  Anti-estrogen oral therapy   Tamoxifen '20mg'$  daily     CHIEF COMPLIANT: Follow-up of left breast cancer on tamoxifen therapy   INTERVAL HISTORY: Gloria TAORMINAis a 57y.o. with above-mentioned history of left breast cancer who underwent a lumpectomy, radiation, and is currently on antiestrogen therapy with tamoxifen. She presents to the clinic today for follow-up. She reports that she is tolerating the tamoxifen with no complaint or side effects. She has night sweats on occasions.   ALLERGIES:  is allergic to penicillins.  MEDICATIONS:  Current Outpatient Medications  Medication Sig Dispense Refill   levothyroxine (SYNTHROID) 100 MCG tablet TAKE 1 TABLET DAILY BEFORE BREAKFAST 90 tablet 3   Multiple Vitamins-Minerals (MULTIVITAMIN) tablet Take 1 tablet by mouth daily.     tamoxifen (NOLVADEX) 10 MG tablet TAKE 1 TABLET DAILY 90 tablet 3   No current facility-administered medications for this visit.    PHYSICAL EXAMINATION: ECOG PERFORMANCE STATUS: 1 - Symptomatic but completely ambulatory  Vitals:   03/22/22 1035  BP: 139/88  Pulse: (!) 105  Resp: 18  Temp: (!) 97.5 F (36.4 C)  SpO2: 100%   Filed Weights   03/22/22 1035  Weight: 144 lb 4.8 oz (65.5 kg)    BREAST: No palpable masses or nodules in either right or left breasts. No palpable axillary supraclavicular or infraclavicular adenopathy no breast tenderness or nipple discharge.  There is significant tenderness in the left breast surgical scar.  (exam performed in the presence of a chaperone)  LABORATORY DATA:  I have reviewed the data  as listed    Latest Ref Rng & Units 10/20/2020    9:59 AM 10/17/2019    9:50 AM 05/07/2018   10:50 AM  CMP  Glucose 70 - 99 mg/dL 88  106  94   BUN 6 - 23 mg/dL '15  16  12   '$ Creatinine 0.40 - 1.20 mg/dL 0.70  0.80  0.80   Sodium 135 - 145 mEq/L 140  140  142   Potassium 3.5 - 5.1 mEq/L 3.5  5.0   4.0   Chloride 96 - 112 mEq/L 105  102  106   CO2 19 - 32 mEq/L '27  24  27   '$ Calcium 8.4 - 10.5 mg/dL 9.2  9.5  8.8   Total Protein 6.0 - 8.3 g/dL 7.0  7.0  6.6   Total Bilirubin 0.2 - 1.2 mg/dL 0.8  0.7  0.6   Alkaline Phos 39 - 117 U/L 49  55  46   AST 0 - 37 U/L '20  22  21   '$ ALT 0 - 35 U/L '14  14  12     '$ Lab Results  Component Value Date   WBC 4.1 10/20/2020   HGB 13.8 10/20/2020   HCT 39.7 10/20/2020   MCV 91.6 10/20/2020   PLT 182.0 10/20/2020   NEUTROABS 3.0 10/20/2020    ASSESSMENT & PLAN:  Malignant neoplasm of upper-inner quadrant of left breast in female, estrogen receptor positive (Gloria Kramer) 08/27/2017:Left lumpectomy: IDC grade 2, 2.2 cm, low-grade DCIS, lymphovascular invasion identified, margins negative, 1 lymph node with micrometastatic disease, 3 additional lymph nodes negative, ER 95%, PR 90%, Ki-67 15%, HER-2 negative ratio 1.26, T2N1 mic stage Ib Oncotype DX recurrence score 17: Risk of distant recurrence 5% Adjuvant radiation therapy 10/13/2017 to 11/19/2017   Treatment plan: Oral antiestrogen therapy with tamoxifen 20 mg daily x10 years started 12/17/2017   Tamoxifen toxicities: Hot flashes We will obtain breast cancer index to determine if she would benefit from extended endocrine therapy.  Will call her with the result of this test.  Patient is exercising frequently and has been eating very healthy.   Breast cancer surveillance: 1.  Breast exam 03/22/2022: Benign 2.  Mammogram 07/22/21: Postlumpectomy changes, benign, breast density category C 3.  Breast MRI 07/22/20: benign, density Cat C. Right breast biopsies 03/06/2019: ADH with fibrocystic changes and benign breast tissue   Return to clinic in 1 year for follow-up    No orders of the defined types were placed in this encounter.  The patient has a good understanding of the overall plan. she agrees with it. she will call with any problems that may develop before the next visit here. Total time spent: 30  mins including face to face time and time spent for planning, charting and co-ordination of care   Harriette Ohara, MD 03/22/22    I Gardiner Coins am acting as a Education administrator for [employer]  I have reviewed the above documentation for accuracy and completeness, and I agree with the above.

## 2022-03-24 ENCOUNTER — Telehealth: Payer: Self-pay | Admitting: *Deleted

## 2022-03-24 NOTE — Telephone Encounter (Signed)
Ordered BCI per Dr. Gudena. Requisition sent to pathology and biotheranostics. 

## 2022-04-13 ENCOUNTER — Encounter: Payer: Self-pay | Admitting: Hematology and Oncology

## 2022-04-14 ENCOUNTER — Encounter: Payer: Self-pay | Admitting: Hematology and Oncology

## 2022-04-17 ENCOUNTER — Encounter: Payer: Self-pay | Admitting: Hematology and Oncology

## 2022-04-20 ENCOUNTER — Telehealth: Payer: Self-pay | Admitting: *Deleted

## 2022-04-20 NOTE — Telephone Encounter (Signed)
Received BCI report for pt with recommendations that pt would benefit from extended endocrine therapy.  Per MD pt needing telephone visit to review results and discuss management of hot flashes while on Tamoxifen.  Appt scheduled, pt notified and verbalized understanding of appt date and time.

## 2022-04-24 ENCOUNTER — Encounter: Payer: Self-pay | Admitting: Adult Health

## 2022-04-24 ENCOUNTER — Inpatient Hospital Stay: Payer: 59 | Attending: Hematology and Oncology | Admitting: Adult Health

## 2022-04-24 DIAGNOSIS — Z170 Estrogen receptor positive status [ER+]: Secondary | ICD-10-CM | POA: Diagnosis not present

## 2022-04-24 DIAGNOSIS — C50212 Malignant neoplasm of upper-inner quadrant of left female breast: Secondary | ICD-10-CM

## 2022-04-24 DIAGNOSIS — Z1239 Encounter for other screening for malignant neoplasm of breast: Secondary | ICD-10-CM

## 2022-04-24 NOTE — Assessment & Plan Note (Signed)
Gloria Kramer is a 57 year old woman with left breast stage IB ER/PR positive breast cancer diagnosed in 06/2017 s/p left lumpectomy, adjuvant radiation therapy, and Tamoxifen daily which began in 11/2017.  Gloria Kramer has no signs of breast cancer recurrence and continues on Tamoxifen daily with good tolerance.  We reviewed her breast cancer index results in detail and her initial thought is to discontinue tamoxifen when she completes 5 years of therapy which will be in October 2024.  We are mailing her the breast cancer index test results so she can review them and look at them in more detail.  If she changes her mind and decides to complete a full 10 years of antiestrogen therapy with tamoxifen she will let us know.  Otherwise she plans on taking the tamoxifen until the end of October of this year.  Gloria Kramer is participating in all of the risk reduction activities for her lifestyle including healthy diet, exercise, along with avoiding alcohol and cigarettes.  She will be due for mammogram in May and breast MRI in November since she is high risk due to her family history.  She has follow-up with Dr. Lindi Adie in January 2025.

## 2022-04-24 NOTE — Progress Notes (Signed)
[facility] Follow up:    Wendie Agreste, MD [address] [place] [address]   DIAGNOSIS:  Cancer Staging  Malignant neoplasm of upper-inner quadrant of left breast in female, estrogen receptor positive ([place]) Staging form: Breast, AJCC 8th Edition - Clinical: Stage IB (cT2, cN0, cM0, G2, ER+, PR+, HER2-) - Unsigned Method of lymph node assessment: Clinical Histologic grading system: 3 grade system Tumor size (mm): 25 - Pathologic: Stage IB (pT2, pN43m, cM0, G2, ER+, PR+, HER2-) - Signed by GNicholas Lose MD on 09/03/2017 Neoadjuvant therapy: No Nuclear grade: G3 Histologic grading system: 3 grade system Laterality: Left Tumor size (mm): 22  I connected with SBeverly Gust Pichon on 04/24/22 at  9:15 AM EST by telephone and verified that I am speaking with the correct person using two identifiers.  I discussed the limitations, risks, security and privacy concerns of performing an evaluation and management service by telephone and the availability of in person appointments.  I also discussed with the patient that there may be a patient responsible charge related to this service. The patient expressed understanding and agreed to proceed.  Patient location: home Provider location: [facility] provider office Others participating in call: none  SUMMARY OF ONCOLOGIC HISTORY: Oncology History  Malignant neoplasm of upper-inner quadrant of left breast in female, estrogen receptor positive ([place]  07/09/2017 Initial Diagnosis   Palpable mass left breast 3 cm from the nipple measuring 1.6 x 1.8 x 2.5 cm, biopsy revealed grade 2 IDC ER 95%, PR 90%, Ki-67 15%, HER-2 negative ratio 1.26, T2 N0 stage Ib clinical stage AJCC 8   07/27/2017 Genetic Testing   CHEK2 p.M381V VUS identified on the CancerNext panel.  The CancerNext gene panel offered by [employer] sequencing and rearrangement analysis for the following 34 genes:   APC, ATM, BARD1, BMPR1A, BRCA1, BRCA2,  BRIP1, CDH1, CDK4, CDKN2A, CHEK2, DICER1, HOXB13, EPCAM, GREM1, MLH1, MRE11A, MSH2, MSH6, MUTYH, NBN, NF1, PALB2, PMS2, POLD1, POLE, PTEN, RAD50, RAD51C, RAD51D, SMAD4, SMARCA4, STK11, and TP53.  The report date is Jul 27, 2017.   08/27/2017 Surgery   Left lumpectomy: IDC grade 2, 2.2 cm, low-grade DCIS, lymphovascular invasion identified, margins negative, 1 lymph node with micrometastatic disease, 3 additional lymph nodes negative, ER 95%, PR 90%, Ki-67 15%, HER-2 negative ratio 1.26, T2N1 mic stage Ib   08/27/2017 Oncotype testing   17/5%   09/03/2017 Cancer Staging   Staging form: Breast, AJCC 8th Edition - Pathologic: Stage IB (pT2, pN146m cM0, G2, ER+, PR+, HER2-) - Signed by GuNicholas LoseMD on 09/03/2017   10/15/2017 - 11/19/2017 Radiation Therapy   Adjuvant XRT   12/17/2017 -  Anti-estrogen oral therapy   Tamoxifen '20mg'$  daily   04/20/2022 Miscellaneous   Breast Cancer Index testing determines a likely benefit from extended endocrine therapy predicting risk of late distant recurrence (within 5-10 years) of 11.5% with 5 years of adjuvant endocrine therapy, and 3.8%-4.8% with 10 years of adjuvant endocrine therapy.       CURRENT THERAPY: Tamoxifen daily  INTERVAL HISTORY: SuZAKYIA BAYAT89.o. female returns for f/u to discuss her Breast Cancer Index Test Results.  These showed a predictive benefit from extending Tamoxifen from 5 years of daily therapy to 10 years of daily therapy.  Her risk of late distant recurrence with 5 years of AET is 11.5% as compared to 3.8% -4.8% with 10 years of AET.    Dietary changes: avoids sugar, only consumes natural sugars I.e.  honey/maple sugar, eats well balanced diet with fruits and vegetables.   Exercise: She exercises by walking 2 miles/day 5 days a week and has a trainer at the gym she exercises with 2 days a week.    She does not smoke cigarettes or drink ETOH.  She sees her gynecologist regularly, and her PCP.    Her most recent mammogram  occurred on 07/22/2021 and demonstrated no mammographic evidence of malignancy in the bilateral breasts.    Patient Active Problem List   Diagnosis Date Noted   Genetic testing 07/30/2017   Family history of breast cancer    Family history of brain cancer    Malignant neoplasm of upper-inner quadrant of left breast in female, estrogen receptor positive ([place]) 07/18/2017   Iron deficiency anemia 03/08/2017   Hypothyroid 01/16/2011   Perimenopause 01/16/2011    is allergic to penicillins.  MEDICAL HISTORY: Past Medical History:  Diagnosis Date   Anemia    iron deficiency from heavy periods   Cancer ([place]) 07/2017   left breast cancer   Family history of brain cancer    Family history of breast cancer    Hypothyroidism    Hoshimoto's thyroiditis   Personal history of radiation therapy    Thyroid disease     SURGICAL HISTORY:  Past Surgical History:  Procedure Laterality Date   BREAST BIOPSY Left 07/09/2017   BREAST BIOPSY Right 08/09/2017   BREAST BIOPSY Bilateral 03/06/2019   BREAST LUMPECTOMY Left 07/2017   BREAST LUMPECTOMY WITH AXILLARY LYMPH NODE BIOPSY Left 08/27/2017   Procedure: LEFT BREAST LUMPECTOMY WITH AXILLARY LYMPH NODE BIOPSY;  Surgeon: Jovita Kussmaul, MD;  Location: [facility];  Service: General;  Laterality: Left;   WISDOM TOOTH EXTRACTION      SOCIAL HISTORY: Social History   Socioeconomic History   Marital status: Married    Spouse name: Not on file   Number of children: 5   Years of education: Not on file   Highest education level: Not on file  Occupational History   Not on file  Tobacco Use   Smoking status: Never   Smokeless tobacco: Never  Vaping Use   Vaping Use: Never used  Substance and Sexual Activity   Alcohol use: Not Currently    Comment: 1 drink or beer or wine a few nights a week    Drug use: Never   Sexual activity: Yes    Birth control/protection: None  Other Topics Concern   Not on file  Social History  Narrative   Not on file   Social Determinants of Health   Financial Resource Strain: Not on file  Food Insecurity: Not on file  Transportation Needs: Not on file  Physical Activity: Not on file  Stress: Not on file  Social Connections: Not on file  Intimate Partner Violence: Not on file    FAMILY HISTORY: Family History  Problem Relation Age of Onset   Stroke Mother    Breast cancer Mother    Bladder Cancer Father    Hypothyroidism Sister    Breast cancer Sister    Breast cancer Paternal Aunt    Dementia Paternal Aunt    Heart disease Maternal Grandmother        d. 13   Brain cancer Maternal Grandfather        d. in his 50s   Breast cancer Cousin 57       mat first cousin   Hashimoto's thyroiditis Cousin    Lupus Cousin  mat first cousin   Hashimoto's thyroiditis Son 12    Review of Systems  Constitutional:  Negative for appetite change, chills, fatigue, fever and unexpected weight change.  HENT:   Negative for hearing loss, lump/mass and trouble swallowing.   Eyes:  Negative for eye problems and icterus.  Respiratory:  Negative for chest tightness, cough and shortness of breath.   Cardiovascular:  Negative for chest pain, leg swelling and palpitations.  Gastrointestinal:  Negative for abdominal distention, abdominal pain, constipation, diarrhea, nausea and vomiting.  Endocrine: Negative for hot flashes.  Genitourinary:  Negative for difficulty urinating.   Musculoskeletal:  Negative for arthralgias.  Skin:  Negative for itching and rash.  Neurological:  Negative for dizziness, extremity weakness, headaches and numbness.  Hematological:  Negative for adenopathy. Does not bruise/bleed easily.  Psychiatric/Behavioral:  Negative for depression. The patient is not nervous/anxious.       PHYSICAL EXAMINATION Patient sounds well.  She is in no apparent distress, mood and behavior are normal, breathing non labored, speech is normal  LABORATORY DATA: None for  this visit    ASSESSMENT and THERAPY PLAN:   Malignant neoplasm of upper-inner quadrant of left breast in female, estrogen receptor positive ([place]) Johnasia Balistreri is a 57 year old woman with left breast stage IB ER/PR positive breast cancer diagnosed in 06/2017 s/p left lumpectomy, adjuvant radiation therapy, and Tamoxifen daily which began in 11/2017.  Zamariah has no signs of breast cancer recurrence and continues on Tamoxifen daily with good tolerance.  We reviewed her breast cancer index results in detail and her initial thought is to discontinue tamoxifen when she completes 5 years of therapy which will be in October 2024.  We are mailing her the breast cancer index test results so she can review them and look at them in more detail.  If she changes her mind and decides to complete a full 10 years of antiestrogen therapy with tamoxifen she will let us know.  Otherwise she plans on taking the tamoxifen until the end of October of this year.  Amiylah is participating in all of the risk reduction activities for her lifestyle including healthy diet, exercise, along with avoiding alcohol and cigarettes.  She will be due for mammogram in May and breast MRI in November since she is high risk due to her family history.  She has follow-up with Dr. Lindi Adie in January 2025.       Follow up instructions:    -Return to cancer center 02/2023 for f/u with Dr. Lindi Adie  -Mammogram due in 06/2022 -Breast MRI due 12/2022   The patient was provided an opportunity to ask questions and all were answered. The patient agreed with the plan and demonstrated an understanding of the instructions.   The patient was advised to call back or seek an in-person evaluation if the symptoms worsen or if the condition fails to improve as anticipated.   I provided 25 minutes of non face-to-face telephone visit time during this encounter, and > 50% was spent counseling as documented under my assessment & plan.   Wilber Bihari,  NP 04/24/22 9:45 AM Medical Oncology and Hematology [facility] [place], [place] [address] Tel. [phone]    Fax. [phone]

## 2022-04-25 ENCOUNTER — Encounter: Payer: Self-pay | Admitting: Hematology and Oncology

## 2022-05-01 ENCOUNTER — Other Ambulatory Visit: Payer: Self-pay | Admitting: Family Medicine

## 2022-05-01 DIAGNOSIS — E039 Hypothyroidism, unspecified: Secondary | ICD-10-CM

## 2022-05-03 ENCOUNTER — Encounter: Payer: Self-pay | Admitting: Family Medicine

## 2022-05-03 ENCOUNTER — Encounter (HOSPITAL_COMMUNITY): Payer: Self-pay

## 2022-05-03 NOTE — Telephone Encounter (Signed)
Gloria Kramer, Please contact your pharmacy about this medication. This medication does have 3 refills. If you have any other questions or concerns please let us know.   [place]  [phone]

## 2022-05-19 ENCOUNTER — Encounter: Payer: Self-pay | Admitting: Family Medicine

## 2022-05-19 DIAGNOSIS — E039 Hypothyroidism, unspecified: Secondary | ICD-10-CM

## 2022-05-19 MED ORDER — LEVOTHYROXINE SODIUM 100 MCG PO TABS: 100.0000 ug | ORAL_TABLET | Freq: Every day | ORAL | 1 refills | Status: DC

## 2022-06-14 ENCOUNTER — Encounter: Payer: Self-pay | Admitting: Family Medicine

## 2022-06-14 ENCOUNTER — Ambulatory Visit (INDEPENDENT_AMBULATORY_CARE_PROVIDER_SITE_OTHER): Payer: 59 | Admitting: Family Medicine

## 2022-06-14 VITALS — BP 118/74 | HR 83 | Temp 98.2°F | Ht 67.25 in | Wt 139.8 lb

## 2022-06-14 DIAGNOSIS — E039 Hypothyroidism, unspecified: Secondary | ICD-10-CM

## 2022-06-14 DIAGNOSIS — Z13220 Encounter for screening for lipoid disorders: Secondary | ICD-10-CM

## 2022-06-14 DIAGNOSIS — Z131 Encounter for screening for diabetes mellitus: Secondary | ICD-10-CM | POA: Diagnosis not present

## 2022-06-14 DIAGNOSIS — Z0000 Encounter for general adult medical examination without abnormal findings: Secondary | ICD-10-CM

## 2022-06-14 MED ORDER — LEVOTHYROXINE SODIUM 100 MCG PO TABS: 100.0000 ug | ORAL_TABLET | Freq: Every day | ORAL | 3 refills | Status: DC

## 2022-06-14 NOTE — Progress Notes (Signed)
Subjective:  Patient ID: Gloria Kramer, female    DOB: 1965/08/04  Age: 57 y.o. MRN: [id number]  CC:  Chief Complaint  Patient presents with   Annual Exam    Pt is not fasting     HPI Gloria Kramer presents for Annual Exam, no concerns.   Hypothyroidism: Lab Results  Component Value Date   TSH 0.17 (L) 10/20/2020  Thyroid last tested in August 2022.  Synthroid dosage lowered to 100 mcg daily with planned follow-up labs in 4 to 6 weeks.  Has not had testing since that time. Taking medication daily. Synthroid - feels like this dose works better.  No new hot or cold intolerance. No new hair or skin changes, heart palpitations or new fatigue. No new weight changes.   Normal lipid panel, A1c in 2022. Not fasting.   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  Will complete 5 years of therapy in October.  Option of 10-year of antiestrogen therapy -breast cancer index with potential benefit.  She plans to discontinue tamoxifen after 5 years..  Mammogram planned in May, breast MRI in November, follow-up planned with oncologist in January 2025. Still plans on stopping tamoxifen through end of 5 years for now, but still looking at options.      06/14/2022   12:57 PM 10/20/2020    8:35 AM 08/15/2017   10:42 AM 07/18/2017    1:49 PM 07/21/2014   12:43 PM  Depression screen PHQ 2/9  Decreased Interest 0 0 0 0 0  Down, Depressed, Hopeless 0 0 0 0 0  PHQ - 2 Score 0 0 0 0 0  Altered sleeping 0 0     Tired, decreased energy 0 0     Change in appetite 0 0     Feeling bad or failure about yourself  0 0     Trouble concentrating 0 0     Moving slowly or fidgety/restless 0 0     Suicidal thoughts 0 0     PHQ-9 Score 0 0       Health Maintenance  Topic Date Due   COVID-19 Vaccine (1) Never done   Hepatitis C Screening  Never done   Zoster Vaccines- Shingrix (1 of 2) Never done   COLONOSCOPY (Pts 45-12yrs  Insurance coverage will need to be confirmed)  Never done   PAP SMEAR-Modifier  08/05/2022   MAMMOGRAM  07/23/2022   INFLUENZA VACCINE  09/28/2022   DTaP/Tdap/Td (3 - Td or Tdap) 10/21/2030   HIV Screening  Completed   HPV VACCINES  Aged Out  Colon CA screening - no FH of colon CA  -Screening options with colonoscopy versus Cologuard discussed. Discussed timing of repeat testing intervals if normal, as well as potential need for diagnostic Colonoscopy if positive Cologuard. Understanding expressed, and chose to not screen at this time - personal decision.   Mammogram planned in May as above.  Pap in 2021 - plans to schedule with GYN. Dermatology - no new skin lesions  Declines Hep C screening.   Immunization History  Administered Date(s) Administered   Influenza Split 11/28/2010   Td 10/20/2020   Tdap 01/16/2011  Shingles vaccine - declines.  Covid vaccine - declines. Declines flu vaccine.  No results found. Wears glasses - appt in past year. Bifocals have worked well.   Dental: within 6 months.   Alcohol:none  Tobacco: none  Exercise: walking 4-5 times per week, 2-3  mi, weights 2 times per week.   History Patient Active Problem List   Diagnosis Date Noted   Genetic testing 07/30/2017   Family history of breast cancer    Family history of brain cancer    Malignant neoplasm of upper-inner quadrant of left breast in female, estrogen receptor positive 07/18/2017   Iron deficiency anemia 03/08/2017   Hypothyroid 01/16/2011   Perimenopause 01/16/2011   Past Medical History:  Diagnosis Date   Anemia    iron deficiency from heavy periods   Cancer 07/2017   left breast cancer   Family history of brain cancer    Family history of breast cancer    Hypothyroidism    Hoshimoto's thyroiditis   Personal history of radiation therapy    Thyroid disease    Past Surgical History:  Procedure Laterality Date   BREAST BIOPSY Left 07/09/2017   BREAST BIOPSY Right 08/09/2017    BREAST BIOPSY Bilateral 03/06/2019   BREAST LUMPECTOMY Left 07/2017   BREAST LUMPECTOMY WITH AXILLARY LYMPH NODE BIOPSY Left 08/27/2017   Procedure: LEFT BREAST LUMPECTOMY WITH AXILLARY LYMPH NODE BIOPSY;  Surgeon: Griselda Miner, MD;  Location: [facility];  Service: General;  Laterality: Left;   WISDOM TOOTH EXTRACTION     Allergies  Allergen Reactions   Penicillins Rash    Unsure reaction was when she was a child   Prior to Admission medications   Medication Sig Start Date End Date Taking? Authorizing Provider  levothyroxine (SYNTHROID) 100 MCG tablet Take 1 tablet (100 mcg total) by mouth daily before breakfast. 05/19/22  Yes Shade Flood, MD  Multiple Vitamins-Minerals (MULTIVITAMIN) tablet Take 1 tablet by mouth daily. 03/22/21  Yes Serena Croissant, MD  tamoxifen (NOLVADEX) 10 MG tablet TAKE 1 TABLET DAILY 03/06/22  Yes Serena Croissant, MD   Social History   Socioeconomic History   Marital status: Married    Spouse name: Not on file   Number of children: 5   Years of education: Not on file   Highest education level: Not on file  Occupational History   Not on file  Tobacco Use   Smoking status: Never   Smokeless tobacco: Never  Vaping Use   Vaping Use: Never used  Substance and Sexual Activity   Alcohol use: Not Currently    Comment: 1 drink or beer or wine a few nights a week    Drug use: Never   Sexual activity: Yes    Birth control/protection: None  Other Topics Concern   Not on file  Social History Narrative   Not on file   Social Determinants of Health   Financial Resource Strain: Not on file  Food Insecurity: Not on file  Transportation Needs: Not on file  Physical Activity: Not on file  Stress: Not on file  Social Connections: Not on file  Intimate Partner Violence: Not on file    Review of Systems 13 point review of systems per patient health survey noted.  Negative other than as indicated above or in HPI.    Objective:   Vitals:    06/14/22 1300  BP: 118/74  Pulse: 83  Temp: 98.2 F (36.8 C)  TempSrc: Temporal  SpO2: 100%  Weight: 139 lb 12.8 oz (63.4 kg)  Height: 5' 7.25" (1.708 m)     Physical Exam Constitutional:      Appearance: She is well-developed.  HENT:     Head: Normocephalic and atraumatic.     Right Ear: External ear normal.  Left Ear: External ear normal.  Eyes:     Conjunctiva/sclera: Conjunctivae normal.     Pupils: Pupils are equal, round, and reactive to light.  Neck:     Thyroid: No thyromegaly.  Cardiovascular:     Rate and Rhythm: Normal rate and regular rhythm.     Heart sounds: Normal heart sounds. No murmur heard. Pulmonary:     Effort: Pulmonary effort is normal. No respiratory distress.     Breath sounds: Normal breath sounds. No wheezing.  Abdominal:     General: Bowel sounds are normal.     Palpations: Abdomen is soft.     Tenderness: There is no abdominal tenderness.  Musculoskeletal:        General: No tenderness. Normal range of motion.     Cervical back: Normal range of motion and neck supple.  Lymphadenopathy:     Cervical: No cervical adenopathy.  Skin:    General: Skin is warm and dry.     Findings: No rash.  Neurological:     Mental Status: She is alert and oriented to person, place, and time.  Psychiatric:        Behavior: Behavior normal.        Thought Content: Thought content normal.        Assessment & Plan:  Gloria Kramer is a 57 y.o. female . Annual physical exam - Plan: Comprehensive metabolic panel, Lipid panel, TSH  - -anticipatory guidance as below in AVS, screening labs above. Health maintenance items as above in HPI discussed/recommended as applicable.   Screening for hyperlipidemia - Plan: Comprehensive metabolic panel, Lipid panel  Screening for diabetes mellitus - Plan: Comprehensive metabolic panel  Hypothyroidism, unspecified type - Plan: TSH  -  Stable, tolerating current regimen. Medications refilled. Labs pending as above.     No orders of the defined types were placed in this encounter.  Patient Instructions  Thanks for coming in today. If any concerns on labs I will let you know.  No med changes at this time. Schedule mammogram and gynecology follow-up as planned let me know if there are questions.  Preventive Care 66-45 Years Old, Female Preventive care refers to lifestyle choices and visits with your health care provider that can promote health and wellness. Preventive care visits are also called wellness exams. What can I expect for my preventive care visit? Counseling Your health care provider may ask you questions about your: Medical history, including: Past medical problems. Family medical history. Pregnancy history. Current health, including: Menstrual cycle. Method of birth control. Emotional well-being. Home life and relationship well-being. Sexual activity and sexual health. Lifestyle, including: Alcohol, nicotine or tobacco, and drug use. Access to firearms. Diet, exercise, and sleep habits. Work and work Astronomer. Sunscreen use. Safety issues such as seatbelt and bike helmet use. Physical exam Your health care provider will check your: Height and weight. These may be used to calculate your BMI (body mass index). BMI is a measurement that tells if you are at a healthy weight. Waist circumference. This measures the distance around your waistline. This measurement also tells if you are at a healthy weight and may help predict your risk of certain diseases, such as type 2 diabetes and high blood pressure. Heart rate and blood pressure. Body temperature. Skin for abnormal spots. What immunizations do I need?  Vaccines are usually given at various ages, according to a schedule. Your health care provider will recommend vaccines for you based on your age, medical history, and lifestyle  or other factors, such as travel or where you work. What tests do I need? Screening Your health care  provider may recommend screening tests for certain conditions. This may include: Lipid and cholesterol levels. Diabetes screening. This is done by checking your blood sugar (glucose) after you have not eaten for a while (fasting). Pelvic exam and Pap test. Hepatitis B test. Hepatitis C test. HIV (human immunodeficiency virus) test. STI (sexually transmitted infection) testing, if you are at risk. Lung cancer screening. Colorectal cancer screening. Mammogram. Talk with your health care provider about when you should start having regular mammograms. This may depend on whether you have a family history of breast cancer. BRCA-related cancer screening. This may be done if you have a family history of breast, ovarian, tubal, or peritoneal cancers. Bone density scan. This is done to screen for osteoporosis. Talk with your health care provider about your test results, treatment options, and if necessary, the need for more tests. Follow these instructions at home: Eating and drinking  Eat a diet that includes fresh fruits and vegetables, whole grains, lean protein, and low-fat dairy products. Take vitamin and mineral supplements as recommended by your health care provider. Do not drink alcohol if: Your health care provider tells you not to drink. You are pregnant, may be pregnant, or are planning to become pregnant. If you drink alcohol: Limit how much you have to 0-1 drink a day. Know how much alcohol is in your drink. In the [place] one drink equals one 12 oz bottle of beer (355 mL), one 5 oz glass of wine (148 mL), or one 1 oz glass of hard liquor (44 mL). Lifestyle Brush your teeth every morning and night with fluoride toothpaste. Floss one time each day. Exercise for at least 30 minutes 5 or more days each week. Do not use any products that contain nicotine or tobacco. These products include cigarettes, chewing tobacco, and vaping devices, such as e-cigarettes. If you need help quitting, ask  your health care provider. Do not use drugs. If you are sexually active, practice safe sex. Use a condom or other form of protection to prevent STIs. If you do not wish to become pregnant, use a form of birth control. If you plan to become pregnant, see your health care provider for a prepregnancy visit. Take aspirin only as told by your health care provider. Make sure that you understand how much to take and what form to take. Work with your health care provider to find out whether it is safe and beneficial for you to take aspirin daily. Find healthy ways to manage stress, such as: Meditation, yoga, or listening to music. Journaling. Talking to a trusted person. Spending time with friends and family. Minimize exposure to UV radiation to reduce your risk of skin cancer. Safety Always wear your seat belt while driving or riding in a vehicle. Do not drive: If you have been drinking alcohol. Do not ride with someone who has been drinking. When you are tired or distracted. While texting. If you have been using any mind-altering substances or drugs. Wear a helmet and other protective equipment during sports activities. If you have firearms in your house, make sure you follow all gun safety procedures. Seek help if you have been physically or sexually abused. What's next? Visit your health care provider once a year for an annual wellness visit. Ask your health care provider how often you should have your eyes and teeth checked. Stay up to date on all vaccines.  This information is not intended to replace advice given to you by your health care provider. Make sure you discuss any questions you have with your health care provider. Document Revised: 08/11/2020 Document Reviewed: 08/11/2020 Elsevier Patient Education  2023 Else[employer] Inc.       Signed,   Meredith Staggers, MD [facility], [facility] 06/14/22 1:47 PM

## 2022-06-14 NOTE — Patient Instructions (Addendum)
Thanks for coming in today. If any concerns on labs I will let you know.  No med changes at this time. Schedule mammogram and gynecology follow-up as planned let me know if there are questions.  Preventive Care 44-55 Years Old, Female Preventive care refers to lifestyle choices and visits with your health care provider that can promote health and wellness. Preventive care visits are also called wellness exams. What can I expect for my preventive care visit? Counseling Your health care provider may ask you questions about your: Medical history, including: Past medical problems. Family medical history. Pregnancy history. Current health, including: Menstrual cycle. Method of birth control. Emotional well-being. Home life and relationship well-being. Sexual activity and sexual health. Lifestyle, including: Alcohol, nicotine or tobacco, and drug use. Access to firearms. Diet, exercise, and sleep habits. Work and work Astronomer. Sunscreen use. Safety issues such as seatbelt and bike helmet use. Physical exam Your health care provider will check your: Height and weight. These may be used to calculate your BMI (body mass index). BMI is a measurement that tells if you are at a healthy weight. Waist circumference. This measures the distance around your waistline. This measurement also tells if you are at a healthy weight and may help predict your risk of certain diseases, such as type 2 diabetes and high blood pressure. Heart rate and blood pressure. Body temperature. Skin for abnormal spots. What immunizations do I need?  Vaccines are usually given at various ages, according to a schedule. Your health care provider will recommend vaccines for you based on your age, medical history, and lifestyle or other factors, such as travel or where you work. What tests do I need? Screening Your health care provider may recommend screening tests for certain conditions. This may include: Lipid and  cholesterol levels. Diabetes screening. This is done by checking your blood sugar (glucose) after you have not eaten for a while (fasting). Pelvic exam and Pap test. Hepatitis B test. Hepatitis C test. HIV (human immunodeficiency virus) test. STI (sexually transmitted infection) testing, if you are at risk. Lung cancer screening. Colorectal cancer screening. Mammogram. Talk with your health care provider about when you should start having regular mammograms. This may depend on whether you have a family history of breast cancer. BRCA-related cancer screening. This may be done if you have a family history of breast, ovarian, tubal, or peritoneal cancers. Bone density scan. This is done to screen for osteoporosis. Talk with your health care provider about your test results, treatment options, and if necessary, the need for more tests. Follow these instructions at home: Eating and drinking  Eat a diet that includes fresh fruits and vegetables, whole grains, lean protein, and low-fat dairy products. Take vitamin and mineral supplements as recommended by your health care provider. Do not drink alcohol if: Your health care provider tells you not to drink. You are pregnant, may be pregnant, or are planning to become pregnant. If you drink alcohol: Limit how much you have to 0-1 drink a day. Know how much alcohol is in your drink. In the [place] one drink equals one 12 oz bottle of beer (355 mL), one 5 oz glass of wine (148 mL), or one 1 oz glass of hard liquor (44 mL). Lifestyle Brush your teeth every morning and night with fluoride toothpaste. Floss one time each day. Exercise for at least 30 minutes 5 or more days each week. Do not use any products that contain nicotine or tobacco. These products include cigarettes, chewing tobacco,  and vaping devices, such as e-cigarettes. If you need help quitting, ask your health care provider. Do not use drugs. If you are sexually active, practice safe sex.  Use a condom or other form of protection to prevent STIs. If you do not wish to become pregnant, use a form of birth control. If you plan to become pregnant, see your health care provider for a prepregnancy visit. Take aspirin only as told by your health care provider. Make sure that you understand how much to take and what form to take. Work with your health care provider to find out whether it is safe and beneficial for you to take aspirin daily. Find healthy ways to manage stress, such as: Meditation, yoga, or listening to music. Journaling. Talking to a trusted person. Spending time with friends and family. Minimize exposure to UV radiation to reduce your risk of skin cancer. Safety Always wear your seat belt while driving or riding in a vehicle. Do not drive: If you have been drinking alcohol. Do not ride with someone who has been drinking. When you are tired or distracted. While texting. If you have been using any mind-altering substances or drugs. Wear a helmet and other protective equipment during sports activities. If you have firearms in your house, make sure you follow all gun safety procedures. Seek help if you have been physically or sexually abused. What's next? Visit your health care provider once a year for an annual wellness visit. Ask your health care provider how often you should have your eyes and teeth checked. Stay up to date on all vaccines. This information is not intended to replace advice given to you by your health care provider. Make sure you discuss any questions you have with your health care provider. Document Revised: 08/11/2020 Document Reviewed: 08/11/2020 Elsevier Patient Education  2023 [employer].

## 2022-06-15 ENCOUNTER — Other Ambulatory Visit (INDEPENDENT_AMBULATORY_CARE_PROVIDER_SITE_OTHER): Payer: 59

## 2022-06-15 DIAGNOSIS — Z13220 Encounter for screening for lipoid disorders: Secondary | ICD-10-CM

## 2022-06-15 DIAGNOSIS — Z131 Encounter for screening for diabetes mellitus: Secondary | ICD-10-CM | POA: Diagnosis not present

## 2022-06-15 DIAGNOSIS — Z0000 Encounter for general adult medical examination without abnormal findings: Secondary | ICD-10-CM

## 2022-06-15 DIAGNOSIS — E039 Hypothyroidism, unspecified: Secondary | ICD-10-CM

## 2022-06-15 LAB — COMPREHENSIVE METABOLIC PANEL
ALT: 14 U/L (ref 0–35)
AST: 21 U/L (ref 0–37)
Albumin: 4.5 g/dL (ref 3.5–5.2)
Alkaline Phosphatase: 58 U/L (ref 39–117)
BUN: 19 mg/dL (ref 6–23)
CO2: 30 mEq/L (ref 19–32)
Calcium: 9.3 mg/dL (ref 8.4–10.5)
Chloride: 105 mEq/L (ref 96–112)
Creatinine, Ser: 0.78 mg/dL (ref 0.40–1.20)
GFR: 86.11 mL/min (ref >60.00)
Glucose, Bld: 93 mg/dL (ref 70–99)
Potassium: 4.4 mEq/L (ref 3.5–5.1)
Sodium: 141 mEq/L (ref 135–145)
Total Bilirubin: 0.7 mg/dL (ref 0.2–1.2)
Total Protein: 7 g/dL (ref 6.0–8.3)

## 2022-06-15 LAB — LIPID PANEL
Cholesterol: 159 mg/dL (ref 0–200)
HDL: 50.8 mg/dL (ref >39.00)
LDL Cholesterol: 94 mg/dL (ref 0–99)
NonHDL: 108.4
Total CHOL/HDL Ratio: 3
Triglycerides: 70 mg/dL (ref 0.0–149.0)
VLDL: 14 mg/dL (ref 0.0–40.0)

## 2022-06-15 LAB — TSH: TSH: 0.86 u[IU]/mL (ref 0.35–5.50)

## 2022-06-16 ENCOUNTER — Other Ambulatory Visit: Payer: Self-pay | Admitting: Hematology and Oncology

## 2022-06-16 DIAGNOSIS — Z1231 Encounter for screening mammogram for malignant neoplasm of breast: Secondary | ICD-10-CM

## 2022-07-27 ENCOUNTER — Ambulatory Visit
Admission: RE | Admit: 2022-07-27 | Discharge: 2022-07-27 | Disposition: A | Discharge status: Home / Self Care | Payer: 59 | Source: Ambulatory Visit | Attending: Hematology and Oncology | Admitting: Hematology and Oncology

## 2022-07-27 DIAGNOSIS — Z1231 Encounter for screening mammogram for malignant neoplasm of breast: Secondary | ICD-10-CM

## 2022-11-29 ENCOUNTER — Encounter: Payer: Self-pay | Admitting: Adult Health

## 2022-12-26 ENCOUNTER — Encounter: Payer: Self-pay | Admitting: Adult Health

## 2022-12-29 ENCOUNTER — Telehealth: Payer: Self-pay

## 2022-12-29 ENCOUNTER — Ambulatory Visit
Admission: RE | Admit: 2022-12-29 | Discharge: 2022-12-29 | Disposition: A | Discharge status: Home / Self Care | Payer: 59 | Source: Ambulatory Visit | Attending: Adult Health | Admitting: Adult Health

## 2022-12-29 DIAGNOSIS — Z170 Estrogen receptor positive status [ER+]: Secondary | ICD-10-CM

## 2022-12-29 DIAGNOSIS — Z1239 Encounter for other screening for malignant neoplasm of breast: Secondary | ICD-10-CM

## 2022-12-29 MED ORDER — GADOPICLENOL 0.5 MMOL/ML IV SOLN
6.0000 mL | Freq: Once | INTRAVENOUS | Status: AC | PRN
  Administered 2022-12-29: 6 mL via INTRAVENOUS

## 2022-12-29 NOTE — Telephone Encounter (Signed)
This RN called pt to inform her that her MRI results were negative per Lillard Anes, NP. Pt verbalized understanding.

## 2022-12-29 NOTE — Telephone Encounter (Signed)
-----   Message from Noreene Filbert sent at 12/29/2022  3:14 PM EDT ----- MRI is negative, please call patient with the good news. ----- Message ----- From: Interface, Rad Results In Sent: 12/29/2022   2:00 PM EDT To: Loa Socks, NP

## 2023-03-26 ENCOUNTER — Inpatient Hospital Stay: Payer: 59 | Attending: Hematology and Oncology | Admitting: Hematology and Oncology

## 2023-03-26 VITALS — BP 131/79 | HR 90 | Temp 97.3°F | Resp 18 | Ht 67.25 in | Wt 135.8 lb

## 2023-03-26 DIAGNOSIS — Z170 Estrogen receptor positive status [ER+]: Secondary | ICD-10-CM | POA: Insufficient documentation

## 2023-03-26 DIAGNOSIS — R200 Anesthesia of skin: Secondary | ICD-10-CM | POA: Diagnosis not present

## 2023-03-26 DIAGNOSIS — C50212 Malignant neoplasm of upper-inner quadrant of left female breast: Secondary | ICD-10-CM | POA: Diagnosis not present

## 2023-03-26 DIAGNOSIS — N951 Menopausal and female climacteric states: Secondary | ICD-10-CM | POA: Insufficient documentation

## 2023-03-26 MED ORDER — TAMOXIFEN CITRATE 10 MG PO TABS: 10.0000 mg | ORAL_TABLET | Freq: Every day | ORAL | 3 refills | Status: DC

## 2023-03-26 NOTE — Assessment & Plan Note (Signed)
08/27/2017:Left lumpectomy: IDC grade 2, 2.2 cm, low-grade DCIS, lymphovascular invasion identified, margins negative, 1 lymph node with micrometastatic disease, 3 additional lymph nodes negative, ER 95%, PR 90%, Ki-67 15%, HER-2 negative ratio 1.26, T2N1 mic stage Ib Oncotype DX recurrence score 17: Risk of distant recurrence 5% Adjuvant radiation therapy 10/13/2017 to 11/19/2017   Treatment plan: Oral antiestrogen therapy with tamoxifen 20 mg daily x10 years started 12/17/2017   Tamoxifen toxicities: Hot flashes We will obtain breast cancer index to determine if she would benefit from extended endocrine therapy.  Will call her with the result of this test.   Patient is exercising frequently and has been eating very healthy.   Breast cancer surveillance: 1.  Breast exam 03/26/2023: Benign 2.  Mammogram 07/28/2022: Postlumpectomy changes, benign, breast density category C 3.  Breast MRI 12/29/2022: benign, density Cat C. Right breast biopsies 03/06/2019: ADH with fibrocystic changes and benign breast tissue   Return to clinic in 1 year for follow-up

## 2023-03-26 NOTE — Progress Notes (Signed)
Patient Care Team: Shade Flood, MD as PCP - General (Family Medicine) Serena Croissant, MD as Consulting Physician (Hematology and Oncology) Candice Camp, MD as Consulting Physician (Obstetrics and Gynecology) Dorothy Puffer, MD as Consulting Physician (Radiation Oncology) Griselda Miner, MD as Consulting Physician (General Surgery) Axel Filler Larna Daughters, NP as Nurse Practitioner (Hematology and Oncology)  DIAGNOSIS:  Encounter Diagnosis  Name Primary?   Malignant neoplasm of upper-inner quadrant of left breast in female, estrogen receptor positive (HCC) Yes    SUMMARY OF ONCOLOGIC HISTORY: Oncology History  Malignant neoplasm of upper-inner quadrant of left breast in female, estrogen receptor positive (HCC)  07/09/2017 Initial Diagnosis   Palpable mass left breast 3 cm from the nipple measuring 1.6 x 1.8 x 2.5 cm, biopsy revealed grade 2 IDC ER 95%, PR 90%, Ki-67 15%, HER-2 negative ratio 1.26, T2 N0 stage Ib clinical stage AJCC 8   07/27/2017 Genetic Testing   CHEK2 p.[id number] VUS identified on the CancerNext panel.  The CancerNext gene panel offered by [employer] includes sequencing and rearrangement analysis for the following 34 genes:   APC, ATM, BARD1, BMPR1A, BRCA1, BRCA2, BRIP1, CDH1, CDK4, CDKN2A, CHEK2, DICER1, HOXB13, EPCAM, GREM1, MLH1, MRE11A, MSH2, MSH6, MUTYH, NBN, NF1, PALB2, PMS2, POLD1, POLE, PTEN, RAD50, RAD51C, RAD51D, SMAD4, SMARCA4, STK11, and TP53.  The report date is Jul 27, 2017.   08/27/2017 Surgery   Left lumpectomy: IDC grade 2, 2.2 cm, low-grade DCIS, lymphovascular invasion identified, margins negative, 1 lymph node with micrometastatic disease, 3 additional lymph nodes negative, ER 95%, PR 90%, Ki-67 15%, HER-2 negative ratio 1.26, T2N1 mic stage Ib   08/27/2017 Oncotype testing   17/5%   09/03/2017 Cancer Staging   Staging form: Breast, AJCC 8th Edition - Pathologic: Stage IB (pT2, pN33mi, cM0, G2, ER+, PR+, HER2-) - Signed by Serena Croissant, MD on  09/03/2017   10/15/2017 - 11/19/2017 Radiation Therapy   Adjuvant XRT   12/17/2017 -  Anti-estrogen oral therapy   Tamoxifen 20mg  daily   04/20/2022 Miscellaneous   Breast Cancer Index testing determines a likely benefit from extended endocrine therapy predicting risk of late distant recurrence (within 5-10 years) of 11.5% with 5 years of adjuvant endocrine therapy, and 3.8%-4.8% with 10 years of adjuvant endocrine therapy.       CHIEF COMPLIANT:   HISTORY OF PRESENT ILLNESS: Discussed the use of AI scribe software for clinical note transcription with the patient, who gave verbal consent to proceed.  History of Present Illness   The patient, a breast cancer survivor, has been on Tamoxifen for five years since she surgery in July 2019. She reports experiencing minor side effects from the medication, including hot flashes, hair loss, and joint stiffness. However, she is uncertain whether these symptoms are due to the medication or natural aging. The patient also mentions a persistent numbness in the chest area post-surgery, but expresses no concern about this.  In terms of lifestyle, the patient has been maintaining an active routine, walking daily for about 30 to 45 minutes, covering a distance of 1.5 to 2 miles. She has been managing her stress levels better since her husband's retirement, which has allowed for a more balanced distribution of household responsibilities.  The patient's medication regimen includes Tamoxifen and Thyroxine. She has not reported any new medications or changes to her current regimen. The patient has not reported any new or worsening symptoms since her last consultation.         ALLERGIES:  is allergic to penicillins.  MEDICATIONS:  Current Outpatient Medications  Medication Sig Dispense Refill   levothyroxine (SYNTHROID) 100 MCG tablet Take 1 tablet (100 mcg total) by mouth daily before breakfast. 90 tablet 3   Multiple Vitamins-Minerals (MULTIVITAMIN) tablet  Take 1 tablet by mouth daily.     tamoxifen (NOLVADEX) 10 MG tablet Take 1 tablet (10 mg total) by mouth daily. 90 tablet 3   No current facility-administered medications for this visit.    PHYSICAL EXAMINATION: ECOG PERFORMANCE STATUS: 1 - Symptomatic but completely ambulatory  Vitals:   03/26/23 1020  BP: 131/79  Pulse: 90  Resp: 18  Temp: (!) 97.3 F (36.3 C)  SpO2: 100%   Filed Weights   03/26/23 1020  Weight: 135 lb 12.8 oz (61.6 kg)    Physical Exam   BREAST: Breast tissue palpated, described as feeling normal.      (exam performed in the presence of a chaperone)  LABORATORY DATA:  I have reviewed the data as listed    Latest Ref Rng & Units 06/15/2022    8:07 AM 10/20/2020    9:59 AM 10/17/2019    9:50 AM  CMP  Glucose 70 - 99 mg/dL 93  88  865   BUN 6 - 23 mg/dL 19  15  16    Creatinine 0.40 - 1.20 mg/dL 7.84  6.96  2.95   Sodium 135 - 145 mEq/L 141  140  140   Potassium 3.5 - 5.1 mEq/L 4.4  3.5  5.0   Chloride 96 - 112 mEq/L 105  105  102   CO2 19 - 32 mEq/L 30  27  24    Calcium 8.4 - 10.5 mg/dL 9.3  9.2  9.5   Total Protein 6.0 - 8.3 g/dL 7.0  7.0  7.0   Total Bilirubin 0.2 - 1.2 mg/dL 0.7  0.8  0.7   Alkaline Phos 39 - 117 U/L 58  49  55   AST 0 - 37 U/L 21  20  22    ALT 0 - 35 U/L 14  14  14      Lab Results  Component Value Date   WBC 4.1 10/20/2020   HGB 13.8 10/20/2020   HCT 39.7 10/20/2020   MCV 91.6 10/20/2020   PLT 182.0 10/20/2020   NEUTROABS 3.0 10/20/2020    ASSESSMENT & PLAN:  Malignant neoplasm of upper-inner quadrant of left breast in female, estrogen receptor positive (HCC) 08/27/2017:Left lumpectomy: IDC grade 2, 2.2 cm, low-grade DCIS, lymphovascular invasion identified, margins negative, 1 lymph node with micrometastatic disease, 3 additional lymph nodes negative, ER 95%, PR 90%, Ki-67 15%, HER-2 negative ratio 1.26, T2N1 mic stage Ib Oncotype DX recurrence score 17: Risk of distant recurrence 5% Adjuvant radiation therapy  10/13/2017 to 11/19/2017   Treatment plan: Oral antiestrogen therapy with tamoxifen 20 mg daily x10 years started 12/17/2017   Tamoxifen toxicities: Hot flashes We will obtain breast cancer index to determine if she would benefit from extended endocrine therapy.  Will call her with the result of this test.   Patient is exercising frequently and has been eating very healthy.   Breast cancer surveillance: 1.  Breast exam 03/26/2023: Benign 2.  Mammogram 07/28/2022: Postlumpectomy changes, benign, breast density category C 3.  Breast MRI 12/29/2022: benign, density Cat C. Right breast biopsies 03/06/2019: ADH with fibrocystic changes and benign breast tissue   Return to clinic in 1 year for follow-up       No orders of the defined types were placed in this  encounter.  The patient has a good understanding of the overall plan. she agrees with it. she will call with any problems that may develop before the next visit here. Total time spent: 30 mins including face to face time and time spent for planning, charting and co-ordination of care   Tamsen Meek, MD 03/26/23

## 2023-06-21 ENCOUNTER — Ambulatory Visit (INDEPENDENT_AMBULATORY_CARE_PROVIDER_SITE_OTHER): Payer: 59 | Admitting: Family Medicine

## 2023-06-21 ENCOUNTER — Other Ambulatory Visit: Payer: Self-pay | Admitting: Hematology and Oncology

## 2023-06-21 VITALS — BP 110/64 | HR 75 | Temp 98.7°F | Ht 67.0 in | Wt 134.1 lb

## 2023-06-21 DIAGNOSIS — E039 Hypothyroidism, unspecified: Secondary | ICD-10-CM

## 2023-06-21 DIAGNOSIS — Z131 Encounter for screening for diabetes mellitus: Secondary | ICD-10-CM

## 2023-06-21 DIAGNOSIS — Z1231 Encounter for screening mammogram for malignant neoplasm of breast: Secondary | ICD-10-CM

## 2023-06-21 DIAGNOSIS — Z13220 Encounter for screening for lipoid disorders: Secondary | ICD-10-CM | POA: Diagnosis not present

## 2023-06-21 DIAGNOSIS — Z0000 Encounter for general adult medical examination without abnormal findings: Secondary | ICD-10-CM

## 2023-06-21 DIAGNOSIS — Z8639 Personal history of other endocrine, nutritional and metabolic disease: Secondary | ICD-10-CM | POA: Diagnosis not present

## 2023-06-21 LAB — COMPREHENSIVE METABOLIC PANEL WITH GFR
ALT: 12 U/L (ref 0–35)
AST: 17 U/L (ref 0–37)
Albumin: 4.8 g/dL (ref 3.5–5.2)
Alkaline Phosphatase: 56 U/L (ref 39–117)
BUN: 18 mg/dL (ref 6–23)
CO2: 28 meq/L (ref 19–32)
Calcium: 9.9 mg/dL (ref 8.4–10.5)
Chloride: 102 meq/L (ref 96–112)
Creatinine, Ser: 0.71 mg/dL (ref 0.40–1.20)
GFR: 95.71 mL/min (ref >60.00)
Glucose, Bld: 103 mg/dL — ABNORMAL HIGH (ref 70–99)
Potassium: 4.4 meq/L (ref 3.5–5.1)
Sodium: 138 meq/L (ref 135–145)
Total Bilirubin: 0.7 mg/dL (ref 0.2–1.2)
Total Protein: 7.4 g/dL (ref 6.0–8.3)

## 2023-06-21 LAB — TSH: TSH: 0.17 u[IU]/mL — ABNORMAL LOW (ref 0.35–5.50)

## 2023-06-21 LAB — CBC
HCT: 41 % (ref 36.0–46.0)
Hemoglobin: 14.2 g/dL (ref 12.0–15.0)
MCHC: 34.6 g/dL (ref 30.0–36.0)
MCV: 88.2 fl (ref 78.0–100.0)
Platelets: 229 10*3/uL (ref 150.0–400.0)
RBC: 4.65 Mil/uL (ref 3.87–5.11)
RDW: 12.9 % (ref 11.5–15.5)
WBC: 6.1 10*3/uL (ref 4.0–10.5)

## 2023-06-21 LAB — LIPID PANEL
Cholesterol: 191 mg/dL (ref 0–200)
HDL: 58 mg/dL (ref >39.00)
LDL Cholesterol: 116 mg/dL — ABNORMAL HIGH (ref 0–99)
NonHDL: 132.62
Total CHOL/HDL Ratio: 3
Triglycerides: 82 mg/dL (ref 0.0–149.0)
VLDL: 16.4 mg/dL (ref 0.0–40.0)

## 2023-06-21 MED ORDER — LEVOTHYROXINE SODIUM 100 MCG PO TABS: 100.0000 ug | ORAL_TABLET | Freq: Every day | ORAL | 3 refills | Status: DC

## 2023-06-21 NOTE — Patient Instructions (Addendum)
 Mammogram due in May.  No med changes at this time. If any lab concerns I will let you know.  Let me know if you would like Prevnar (pneumonia vaccine).  Nurse visit is an option. Thanks for coming in today. Let me know if there are any questions.   Preventive Care 40-56 Years Old, Female Preventive care refers to lifestyle choices and visits with your health care provider that can promote health and wellness. Preventive care visits are also called wellness exams. What can I expect for my preventive care visit? Counseling Your health care provider may ask you questions about your: Medical history, including: Past medical problems. Family medical history. Pregnancy history. Current health, including: Menstrual cycle. Method of birth control. Emotional well-being. Home life and relationship well-being. Sexual activity and sexual health. Lifestyle, including: Alcohol, nicotine or tobacco, and drug use. Access to firearms. Diet, exercise, and sleep habits. Work and work Astronomer. Sunscreen use. Safety issues such as seatbelt and bike helmet use. Physical exam Your health care provider will check your: Height and weight. These may be used to calculate your BMI (body mass index). BMI is a measurement that tells if you are at a healthy weight. Waist circumference. This measures the distance around your waistline. This measurement also tells if you are at a healthy weight and may help predict your risk of certain diseases, such as type 2 diabetes and high blood pressure. Heart rate and blood pressure. Body temperature. Skin for abnormal spots. What immunizations do I need?  Vaccines are usually given at various ages, according to a schedule. Your health care provider will recommend vaccines for you based on your age, medical history, and lifestyle or other factors, such as travel or where you work. What tests do I need? Screening Your health care provider may recommend screening  tests for certain conditions. This may include: Lipid and cholesterol levels. Diabetes screening. This is done by checking your blood sugar (glucose) after you have not eaten for a while (fasting). Pelvic exam and Pap test. Hepatitis B test. Hepatitis C test. HIV (human immunodeficiency virus) test. STI (sexually transmitted infection) testing, if you are at risk. Lung cancer screening. Colorectal cancer screening. Mammogram. Talk with your health care provider about when you should start having regular mammograms. This may depend on whether you have a family history of breast cancer. BRCA-related cancer screening. This may be done if you have a family history of breast, ovarian, tubal, or peritoneal cancers. Bone density scan. This is done to screen for osteoporosis. Talk with your health care provider about your test results, treatment options, and if necessary, the need for more tests. Follow these instructions at home: Eating and drinking  Eat a diet that includes fresh fruits and vegetables, whole grains, lean protein, and low-fat dairy products. Take vitamin and mineral supplements as recommended by your health care provider. Do not drink alcohol if: Your health care provider tells you not to drink. You are pregnant, may be pregnant, or are planning to become pregnant. If you drink alcohol: Limit how much you have to 0-1 drink a day. Know how much alcohol is in your drink. In the [place] one drink equals one 12 oz bottle of beer (355 mL), one 5 oz glass of wine (148 mL), or one 1 oz glass of hard liquor (44 mL). Lifestyle Brush your teeth every morning and night with fluoride toothpaste. Floss one time each day. Exercise for at least 30 minutes 5 or more days each week. Do  not use any products that contain nicotine or tobacco. These products include cigarettes, chewing tobacco, and vaping devices, such as e-cigarettes. If you need help quitting, ask your health care provider. Do not  use drugs. If you are sexually active, practice safe sex. Use a condom or other form of protection to prevent STIs. If you do not wish to become pregnant, use a form of birth control. If you plan to become pregnant, see your health care provider for a prepregnancy visit. Take aspirin only as told by your health care provider. Make sure that you understand how much to take and what form to take. Work with your health care provider to find out whether it is safe and beneficial for you to take aspirin daily. Find healthy ways to manage stress, such as: Meditation, yoga, or listening to music. Journaling. Talking to a trusted person. Spending time with friends and family. Minimize exposure to UV radiation to reduce your risk of skin cancer. Safety Always wear your seat belt while driving or riding in a vehicle. Do not drive: If you have been drinking alcohol. Do not ride with someone who has been drinking. When you are tired or distracted. While texting. If you have been using any mind-altering substances or drugs. Wear a helmet and other protective equipment during sports activities. If you have firearms in your house, make sure you follow all gun safety procedures. Seek help if you have been physically or sexually abused. What's next? Visit your health care provider once a year for an annual wellness visit. Ask your health care provider how often you should have your eyes and teeth checked. Stay up to date on all vaccines. This information is not intended to replace advice given to you by your health care provider. Make sure you discuss any questions you have with your health care provider. Document Revised: 08/11/2020 Document Reviewed: 08/11/2020 Elsevier Patient Education  2024 [employer].

## 2023-06-21 NOTE — Progress Notes (Signed)
 Subjective:  Patient ID: Gloria Kramer, female    DOB: 02/18/1966  Age: 58 y.o. MRN: [id number]  CC:  Chief Complaint  Patient presents with   Annual Exam    Pt is here for annual exam Pt reports no concerns    HPI Gloria Kramer presents for Annual Exam  PCP, me Oncology, Dr. Lee Public, Radiation oncology, Dr. Jeryl Moris, surgery, Dr. Alethea Andes.  Left breast cancer, i 2019.  Lumpectomy, radiation therapy, tamoxifen  for antiestrogen therapy for 10 years starting 12/17/2017, 20 mg daily.  Visit in January, discussed hot flashes symptoms, breast cancer index planned to determine if she would benefit from extended endocrine therapy.  1 year follow-up.  MRI breast 12/29/2022, No evidence of malignancy, recommended screening mammogram in May of this year. Gynecology, Dr. Asencion Blacksmith, postmenopausal bleeding, uterine leiomyoma removed in December. Last visit 03/07/2023.  Hypothyroidism: Lab Results  Component Value Date   TSH 0.86 06/15/2022  Synthroid  100 mcg daily. Taking medication daily.  Postmenopausal symptoms. Feels better off tamoxifen  - few doses per week. 10mg .  Weight improved since last year - watching diet.  Wt Readings from Last 3 Encounters:  06/21/23 134 lb 2 oz (60.8 kg)  03/26/23 135 lb 12.8 oz (61.6 kg)  06/14/22 139 lb 12.8 oz (63.4 kg)       06/14/2022   12:57 PM 10/20/2020    8:35 AM 08/15/2017   10:42 AM 07/18/2017    1:49 PM 07/21/2014   12:43 PM  Depression screen PHQ 2/9  Decreased Interest 0 0 0 0 0  Down, Depressed, Hopeless 0 0 0 0 0  PHQ - 2 Score 0 0 0 0 0  Altered sleeping 0 0     Tired, decreased energy 0 0     Change in appetite 0 0     Feeling bad or failure about yourself  0 0     Trouble concentrating 0 0     Moving slowly or fidgety/restless 0 0     Suicidal thoughts 0 0     PHQ-9 Score 0 0       Health Maintenance  Topic Date Due   COVID-19 Vaccine (1) Never done   Hepatitis C Screening  Never done   Zoster Vaccines- Shingrix (1 of 2) Never done    Colonoscopy  Never done   Cervical Cancer Screening (HPV/Pap Cotest)  08/05/2022   INFLUENZA VACCINE  09/28/2023   MAMMOGRAM  12/29/2023   DTaP/Tdap/Td (4 - Td or Tdap) 10/21/2030   HIV Screening  Completed   HPV VACCINES  Aged Out   Meningococcal B Vaccine  Aged Out  Colon cancer screening discussed at her last physical in April 2024 including option of colonoscopy, Cologuard, and she decided to not proceed with screening at that time. Pap testing with gynecology, visit as above with Dr. Asencion Blacksmith recently. UTD on pap - 2024.   Hep C screening: thought she had with prior PCP. Negative. Declines if this was not done anyway.   Immunization History  Administered Date(s) Administered   Influenza Split 11/28/2010   Influenza-Unspecified 09/28/2017   Td 10/20/2020   Tdap 01/16/2011, 10/20/2020  Shingles, COVID booster and flu vaccines have been declined previously. Prevnar - option given. She will let me know.   No results found.  Optho appointment within the past year, has used bifocals that have worked well. Appt in August.   Dental: Within the past 6 months. Appt next week.   Alcohol: none typically.   Tobacco:  none.   Exercise:  5 days per week, walking. Option to add resistance exercise. In process of moving up on mileage and time with walks.   Remote hx of iron  deficiency in 2019. Prior iron  infusions no current supplement. Uses cast iron  pan, greens in diet.   History Patient Active Problem List   Diagnosis Date Noted   Malignant neoplasm of upper-inner quadrant of left breast in female, estrogen receptor positive (HCC) 07/18/2017   Hypothyroid 01/16/2011   Perimenopause 01/16/2011   Past Medical History:  Diagnosis Date   Anemia    iron  deficiency from heavy periods   Cancer (HCC) 07/2017   left breast cancer   Family history of brain cancer    Family history of breast cancer    Hypothyroidism    Hoshimoto's thyroiditis   Personal history of radiation therapy     Thyroid  disease    Past Surgical History:  Procedure Laterality Date   BREAST BIOPSY Left 07/09/2017   BREAST BIOPSY Right 08/09/2017   BREAST BIOPSY Bilateral 03/06/2019   BREAST LUMPECTOMY Left 07/2017   BREAST LUMPECTOMY WITH AXILLARY LYMPH NODE BIOPSY Left 08/27/2017   Procedure: LEFT BREAST LUMPECTOMY WITH AXILLARY LYMPH NODE BIOPSY;  Surgeon: Caralyn Chandler, MD;  Location: [facility];  Service: General;  Laterality: Left;   WISDOM TOOTH EXTRACTION     Allergies  Allergen Reactions   Penicillins Rash    Unsure reaction was when she was a child   Prior to Admission medications   Medication Sig Start Date End Date Taking? Authorizing Provider  Cholecalciferol (VITAMIN D3) 25 MCG CAPS Take by mouth. With Vitamin K   Yes [provider]  levothyroxine  (SYNTHROID ) 100 MCG tablet Take 1 tablet (100 mcg total) by mouth daily before breakfast. 06/14/22  Yes Benjiman Bras, MD  Multiple Vitamins-Minerals (MULTIVITAMIN) tablet Take 1 tablet by mouth daily. Patient not taking: Reported on 06/21/2023 03/22/21   Cameron Cea, MD   Social History   Socioeconomic History   Marital status: Married    Spouse name: Not on file   Number of children: 5   Years of education: Not on file   Highest education level: Master's degree (e.g., MA, MS, MEng, MEd, MSW, MBA)  Occupational History   Not on file  Tobacco Use   Smoking status: Never   Smokeless tobacco: Never  Vaping Use   Vaping status: Never Used  Substance and Sexual Activity   Alcohol use: Not Currently    Comment: 1 drink or beer or wine a few nights a week    Drug use: Never   Sexual activity: Yes    Birth control/protection: None  Other Topics Concern   Not on file  Social History Narrative   Not on file   Social Drivers of Health   Financial Resource Strain: Patient Declined (06/21/2023)   Overall Financial Resource Strain (CARDIA)    Difficulty of Paying Living Expenses: Patient declined   Food Insecurity: Patient Declined (06/21/2023)   Hunger Vital Sign    Worried About Running Out of Food in the Last Year: Patient declined    Ran Out of Food in the Last Year: Patient declined  Transportation Needs: Patient Declined (06/21/2023)   PRAPARE - Transportation    Lack of Transportation (Medical): Patient declined    Lack of Transportation (Non-Medical): Patient declined  Physical Activity: Sufficiently Active (06/21/2023)   Exercise Vital Sign    Days of Exercise per Week: 5 days  Minutes of Exercise per Session: 30 min  Stress: Patient Declined (06/21/2023)   [employer] of Occupational Health - Occupational Stress Questionnaire    Feeling of Stress : Patient declined  Social Connections: Unknown (06/21/2023)   Social Connection and Isolation Panel [NHANES]    Frequency of Communication with Friends and Family: Patient declined    Frequency of Social Gatherings with Friends and Family: Patient declined    Attends Religious Services: More than 4 times per year    Active Member of [employer] or Organizations: Patient declined    Attends Engineer, structural: Not on file    Marital Status: Married  Catering manager Violence: Not on file    Review of Systems  13 point review of systems per patient health survey noted.  Negative other than as indicated above or in HPI.   Objective:   Vitals:   06/21/23 1301  BP: 110/64  Pulse: 75  Temp: 98.7 F (37.1 C)  SpO2: 98%  Weight: 134 lb 2 oz (60.8 kg)  Height: 5\' 7"  (1.702 m)     Physical Exam Vitals reviewed.  Constitutional:      Appearance: She is well-developed.  HENT:     Head: Normocephalic and atraumatic.     Right Ear: External ear normal.     Left Ear: External ear normal.  Eyes:     Conjunctiva/sclera: Conjunctivae normal.     Pupils: Pupils are equal, round, and reactive to light.  Neck:     Thyroid : No thyromegaly.  Cardiovascular:     Rate and Rhythm: Normal rate and regular rhythm.      Heart sounds: Normal heart sounds. No murmur heard. Pulmonary:     Effort: Pulmonary effort is normal. No respiratory distress.     Breath sounds: Normal breath sounds. No wheezing.  Abdominal:     General: Bowel sounds are normal.     Palpations: Abdomen is soft.     Tenderness: There is no abdominal tenderness.  Musculoskeletal:        General: No tenderness. Normal range of motion.     Cervical back: Normal range of motion and neck supple.  Lymphadenopathy:     Cervical: No cervical adenopathy.  Skin:    General: Skin is warm and dry.     Findings: No rash.  Neurological:     Mental Status: She is alert and oriented to person, place, and time.  Psychiatric:        Behavior: Behavior normal.        Thought Content: Thought content normal.    Assessment & Plan:  Gloria Kramer is a 58 y.o. female . Annual physical exam  - -anticipatory guidance as below in AVS, screening labs above. Health maintenance items as above in HPI discussed/recommended as applicable.   Hypothyroidism, unspecified type - Plan: levothyroxine  (SYNTHROID ) 100 MCG tablet, TSH  Stable, tolerating current regimen. Medications refilled. Labs pending as above.   History of iron  deficiency - Plan: Iron , TIBC and Ferritin Panel, CBC  - Check updated labs, remote history of iron  deficiency, most recent CBC looked okay.  Adjust plan accordingly based on results.  Screening for diabetes mellitus - Plan: Comprehensive metabolic panel with GFR  Screening for hyperlipidemia - Plan: Comprehensive metabolic panel with GFR, Lipid panel   Meds ordered this encounter  Medications   levothyroxine  (SYNTHROID ) 100 MCG tablet    Sig: Take 1 tablet (100 mcg total) by mouth daily before breakfast.    Dispense:  90 tablet    Refill:  3   Patient Instructions  Mammogram due in May.  No med changes at this time. If any lab concerns I will let you know.  Let me know if you would like Prevnar (pneumonia vaccine).  Nurse  visit is an option. Thanks for coming in today. Let me know if there are any questions.   Preventive Care 76-70 Years Old, Female Preventive care refers to lifestyle choices and visits with your health care provider that can promote health and wellness. Preventive care visits are also called wellness exams. What can I expect for my preventive care visit? Counseling Your health care provider may ask you questions about your: Medical history, including: Past medical problems. Family medical history. Pregnancy history. Current health, including: Menstrual cycle. Method of birth control. Emotional well-being. Home life and relationship well-being. Sexual activity and sexual health. Lifestyle, including: Alcohol, nicotine or tobacco, and drug use. Access to firearms. Diet, exercise, and sleep habits. Work and work Astronomer. Sunscreen use. Safety issues such as seatbelt and bike helmet use. Physical exam Your health care provider will check your: Height and weight. These may be used to calculate your BMI (body mass index). BMI is a measurement that tells if you are at a healthy weight. Waist circumference. This measures the distance around your waistline. This measurement also tells if you are at a healthy weight and may help predict your risk of certain diseases, such as type 2 diabetes and high blood pressure. Heart rate and blood pressure. Body temperature. Skin for abnormal spots. What immunizations do I need?  Vaccines are usually given at various ages, according to a schedule. Your health care provider will recommend vaccines for you based on your age, medical history, and lifestyle or other factors, such as travel or where you work. What tests do I need? Screening Your health care provider may recommend screening tests for certain conditions. This may include: Lipid and cholesterol levels. Diabetes screening. This is done by checking your blood sugar (glucose) after you have  not eaten for a while (fasting). Pelvic exam and Pap test. Hepatitis B test. Hepatitis C test. HIV (human immunodeficiency virus) test. STI (sexually transmitted infection) testing, if you are at risk. Lung cancer screening. Colorectal cancer screening. Mammogram. Talk with your health care provider about when you should start having regular mammograms. This may depend on whether you have a family history of breast cancer. BRCA-related cancer screening. This may be done if you have a family history of breast, ovarian, tubal, or peritoneal cancers. Bone density scan. This is done to screen for osteoporosis. Talk with your health care provider about your test results, treatment options, and if necessary, the need for more tests. Follow these instructions at home: Eating and drinking  Eat a diet that includes fresh fruits and vegetables, whole grains, lean protein, and low-fat dairy products. Take vitamin and mineral supplements as recommended by your health care provider. Do not drink alcohol if: Your health care provider tells you not to drink. You are pregnant, may be pregnant, or are planning to become pregnant. If you drink alcohol: Limit how much you have to 0-1 drink a day. Know how much alcohol is in your drink. In the [place] one drink equals one 12 oz bottle of beer (355 mL), one 5 oz glass of wine (148 mL), or one 1 oz glass of hard liquor (44 mL). Lifestyle Brush your teeth every morning and night with fluoride toothpaste. Floss one time each  day. Exercise for at least 30 minutes 5 or more days each week. Do not use any products that contain nicotine or tobacco. These products include cigarettes, chewing tobacco, and vaping devices, such as e-cigarettes. If you need help quitting, ask your health care provider. Do not use drugs. If you are sexually active, practice safe sex. Use a condom or other form of protection to prevent STIs. If you do not wish to become pregnant, use a  form of birth control. If you plan to become pregnant, see your health care provider for a prepregnancy visit. Take aspirin only as told by your health care provider. Make sure that you understand how much to take and what form to take. Work with your health care provider to find out whether it is safe and beneficial for you to take aspirin daily. Find healthy ways to manage stress, such as: Meditation, yoga, or listening to music. Journaling. Talking to a trusted person. Spending time with friends and family. Minimize exposure to UV radiation to reduce your risk of skin cancer. Safety Always wear your seat belt while driving or riding in a vehicle. Do not drive: If you have been drinking alcohol. Do not ride with someone who has been drinking. When you are tired or distracted. While texting. If you have been using any mind-altering substances or drugs. Wear a helmet and other protective equipment during sports activities. If you have firearms in your house, make sure you follow all gun safety procedures. Seek help if you have been physically or sexually abused. What's next? Visit your health care provider once a year for an annual wellness visit. Ask your health care provider how often you should have your eyes and teeth checked. Stay up to date on all vaccines. This information is not intended to replace advice given to you by your health care provider. Make sure you discuss any questions you have with your health care provider. Document Revised: 08/11/2020 Document Reviewed: 08/11/2020 Elsevier Patient Education  2024 Elsevier Inc.    Signed,   Caro Christmas, MD [facility], [facility] 06/21/23 1:51 PM

## 2023-06-22 LAB — IRON,TIBC AND FERRITIN PANEL
%SAT: 25 % (ref 16–45)
Ferritin: 20 ng/mL (ref 16–232)
Iron: 92 ug/dL (ref 45–160)
TIBC: 372 ug/dL (ref 250–450)

## 2023-06-27 ENCOUNTER — Encounter: Payer: Self-pay | Admitting: Family Medicine

## 2023-06-28 ENCOUNTER — Other Ambulatory Visit: Payer: Self-pay | Admitting: Family Medicine

## 2023-06-28 ENCOUNTER — Encounter: Payer: Self-pay | Admitting: Family Medicine

## 2023-06-28 DIAGNOSIS — E039 Hypothyroidism, unspecified: Secondary | ICD-10-CM

## 2023-06-28 MED ORDER — LEVOTHYROXINE SODIUM 88 MCG PO TABS: 88.0000 ug | ORAL_TABLET | Freq: Every day | ORAL | 1 refills | Status: DC

## 2023-06-28 NOTE — Progress Notes (Signed)
 See lab results, lowered her dose of Synthroid  with plan on repeat labs in 6 weeks.

## 2023-06-28 NOTE — Telephone Encounter (Signed)
 Replied by lab result note.

## 2023-06-28 NOTE — Telephone Encounter (Signed)
 Patient has read message on my chart and her lab appt has been scheduled for 6 weeks. Lab order is in.

## 2023-06-28 NOTE — Telephone Encounter (Signed)
 Questioning lad results for thyroid , please advise

## 2023-06-29 ENCOUNTER — Other Ambulatory Visit: Payer: Self-pay

## 2023-06-29 DIAGNOSIS — E039 Hypothyroidism, unspecified: Secondary | ICD-10-CM

## 2023-06-29 MED ORDER — LEVOTHYROXINE SODIUM 88 MCG PO TABS: 88.0000 ug | ORAL_TABLET | Freq: Every day | ORAL | 1 refills | Status: DC

## 2023-07-30 ENCOUNTER — Ambulatory Visit

## 2023-08-01 ENCOUNTER — Other Ambulatory Visit: Payer: Self-pay | Admitting: Medical Genetics

## 2023-08-02 ENCOUNTER — Ambulatory Visit
Admission: RE | Admit: 2023-08-02 | Discharge: 2023-08-02 | Disposition: A | Discharge status: Home / Self Care | Source: Ambulatory Visit | Attending: Hematology and Oncology | Admitting: Hematology and Oncology

## 2023-08-02 DIAGNOSIS — Z1231 Encounter for screening mammogram for malignant neoplasm of breast: Secondary | ICD-10-CM

## 2023-08-03 ENCOUNTER — Other Ambulatory Visit (HOSPITAL_COMMUNITY): Payer: Self-pay

## 2023-08-09 ENCOUNTER — Other Ambulatory Visit

## 2023-08-16 ENCOUNTER — Other Ambulatory Visit

## 2023-09-12 ENCOUNTER — Other Ambulatory Visit: Payer: Self-pay

## 2023-09-12 DIAGNOSIS — Z006 Encounter for examination for normal comparison and control in clinical research program: Secondary | ICD-10-CM

## 2023-09-24 LAB — GENECONNECT MOLECULAR SCREEN: Genetic Analysis Overall Interpretation: NEGATIVE

## 2023-12-10 ENCOUNTER — Other Ambulatory Visit: Payer: Self-pay | Admitting: Family Medicine

## 2023-12-10 DIAGNOSIS — E039 Hypothyroidism, unspecified: Secondary | ICD-10-CM

## 2024-03-03 ENCOUNTER — Other Ambulatory Visit: Payer: Self-pay | Admitting: Hematology and Oncology

## 2024-03-26 ENCOUNTER — Encounter: Payer: Self-pay | Admitting: Hematology and Oncology

## 2024-03-26 ENCOUNTER — Inpatient Hospital Stay: Payer: 59 | Admitting: Hematology and Oncology

## 2024-03-26 DIAGNOSIS — C50212 Malignant neoplasm of upper-inner quadrant of left female breast: Secondary | ICD-10-CM

## 2024-03-26 DIAGNOSIS — Z170 Estrogen receptor positive status [ER+]: Secondary | ICD-10-CM | POA: Diagnosis not present

## 2024-03-26 DIAGNOSIS — Z1239 Encounter for other screening for malignant neoplasm of breast: Secondary | ICD-10-CM | POA: Diagnosis not present

## 2024-03-26 NOTE — Progress Notes (Signed)
 HEMATOLOGY-ONCOLOGY TELEPHONE VISIT PROGRESS NOTE  I connected with our patient on 03/26/24 at 10:30 AM EST by telephone and verified that I am speaking with the correct person using two identifiers.  I discussed the limitations, risks, security and privacy concerns of performing an evaluation and management service by telephone and the availability of in person appointments.  I also discussed with the patient that there may be a patient responsible charge related to this service. The patient expressed understanding and agreed to proceed.   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  She stopped tamoxifen  six months ago due to persistent pruritus, which resolved completely after discontinuation. She has no current pruritus or vasomotor symptoms and is postmenopausal with amenorrhea since starting tamoxifen .  She feels well and denies breast pain, masses, nipple discharge, abnormal bleeding, bruising, fevers, chills, sweats, or new palpable lesions. She has had no new systemic symptoms since her last visit in January 2025.  Her mammogram in June 2025 and breast MRI in December 2024 were unremarkable. She is interested in information about circulating tumor DNA blood testing for early detection of recurrence.      Oncology History  Malignant neoplasm of upper-inner quadrant of left breast in female, estrogen receptor positive (HCC)  07/09/2017 Initial Diagnosis   Palpable mass left breast 3 cm from the nipple measuring 1.6 x 1.8 x 2.5 cm, biopsy revealed grade 2 IDC ER 95%, PR 90%, Ki-67 15%, HER-2 negative ratio 1.26, T2 N0 stage Ib clinical stage AJCC 8   07/27/2017 Genetic Testing   CHEK2 p.[id number] VUS identified on the CancerNext panel.   The CancerNext gene panel offered by [employer] includes sequencing and rearrangement analysis for the following 34 genes:   APC, ATM, BARD1, BMPR1A, BRCA1, BRCA2, BRIP1, CDH1, CDK4, CDKN2A, CHEK2, DICER1, HOXB13, EPCAM, GREM1, MLH1, MRE11A, MSH2, MSH6, MUTYH, NBN, NF1, PALB2, PMS2, POLD1, POLE, PTEN, RAD50, RAD51C, RAD51D, SMAD4, SMARCA4, STK11, and TP53.  The report date is Jul 27, 2017.   08/27/2017 Surgery   Left lumpectomy: IDC grade 2, 2.2 cm, low-grade DCIS, lymphovascular invasion identified, margins negative, 1 lymph node with micrometastatic disease, 3 additional lymph nodes negative, ER 95%, PR 90%, Ki-67 15%, HER-2 negative ratio 1.26, T2N1 mic stage Ib   08/27/2017 Oncotype testing   17/5%   09/03/2017 Cancer Staging   Staging form: Breast, AJCC 8th Edition - Pathologic: Stage IB (pT2, pN67mi, cM0, G2, ER+, PR+, HER2-) - Signed by Odean Potts, MD on 09/03/2017   10/15/2017 - 11/19/2017 Radiation Therapy   Adjuvant XRT   12/17/2017 -  Anti-estrogen oral therapy   Tamoxifen  20mg  daily   04/20/2022 Miscellaneous   Breast Cancer Index testing determines a likely benefit from extended endocrine therapy predicting risk of late distant recurrence (within 5-10 years) of 11.5% with 5 years of adjuvant endocrine therapy, and 3.8%-4.8% with 10 years of adjuvant endocrine therapy.       REVIEW OF SYSTEMS:   Constitutional: Denies fevers, chills or abnormal weight loss All other systems were reviewed with the patient and are negative. Observations/Objective:     Assessment Plan:  Malignant neoplasm of upper-inner quadrant of left breast in female, estrogen receptor positive (HCC) 08/27/2017:Left lumpectomy: IDC grade 2, 2.2 cm, low-grade DCIS, lymphovascular invasion identified, margins negative, 1 lymph node with micrometastatic disease,  3 additional lymph nodes negative, ER 95%, PR 90%, Ki-67 15%, HER-2 negative ratio 1.26, T2N1 mic stage Ib Oncotype DX recurrence score 17: Risk of distant  recurrence 5% Adjuvant radiation therapy 10/13/2017 to 11/19/2017   Treatment plan: Oral antiestrogen therapy with tamoxifen  20 mg daily  started 12/17/2017 stopped July 2025 Hot flashes better.  She's in menopause now   Patient is exercising frequently and has been eating very healthy.   Breast cancer surveillance: 1.  Mammogram 08/02/2023: Postlumpectomy changes, benign, breast density category C 2.  Breast MRI 12/29/2022: benign, density Cat C. 3. Guardant reveal was discussed and she will read about it and make a decision. Right breast biopsies 03/06/2019: ADH with fibrocystic changes and benign breast tissue   She had a grandboy born on Dec 31st 2025 Return to clinic in 1 year for follow-up   I discussed the assessment and treatment plan with the patient. The patient was provided an opportunity to ask questions and all were answered. The patient agreed with the plan and demonstrated an understanding of the instructions. The patient was advised to call back or seek an in-person evaluation if the symptoms worsen or if the condition fails to improve as anticipated.   I provided 20 minutes of non-face-to-face time during this encounter.  This includes time for charting and coordination of care   Naomi MARLA Chad, MD

## 2024-03-26 NOTE — Assessment & Plan Note (Signed)
 08/27/2017:Left lumpectomy: IDC grade 2, 2.2 cm, low-grade DCIS, lymphovascular invasion identified, margins negative, 1 lymph node with micrometastatic disease, 3 additional lymph nodes negative, ER 95%, PR 90%, Ki-67 15%, HER-2 negative ratio 1.26, T2N1 mic stage Ib Oncotype DX recurrence score 17: Risk of distant recurrence 5% Adjuvant radiation therapy 10/13/2017 to 11/19/2017   Treatment plan: Oral antiestrogen therapy with tamoxifen  20 mg daily x10 years started 12/17/2017   Tamoxifen  toxicities: Hot flashes We will obtain breast cancer index to determine if she would benefit from extended endocrine therapy.  Will call her with the result of this test.   Patient is exercising frequently and has been eating very healthy.   Breast cancer surveillance: 1.  Breast exam 03/26/2024: Benign 2.  Mammogram 08/02/2022: Postlumpectomy changes, benign, breast density category C 3.  Breast MRI 12/29/2022: benign, density Cat C. Right breast biopsies 03/06/2019: ADH with fibrocystic changes and benign breast tissue   Return to clinic in 1 year for follow-up

## 2024-06-23 ENCOUNTER — Encounter: Admitting: Family Medicine
